# Patient Record
Sex: Female | Born: 1985 | Race: Black or African American | Hispanic: No | Marital: Single | Smoking: Current every day smoker
Health system: Southern US, Community
[De-identification: ages and names within clinical notes are randomized; demographics above are authoritative.]

## PROBLEM LIST (undated history)

## (undated) DIAGNOSIS — R030 Elevated blood-pressure reading, without diagnosis of hypertension: Secondary | ICD-10-CM

## (undated) HISTORY — DX: Elevated blood-pressure reading, without diagnosis of hypertension: R03.0

## (undated) HISTORY — PX: NO PAST SURGERIES: SHX2092

---
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
## 2006-02-09 ENCOUNTER — Emergency Department (HOSPITAL_COMMUNITY): Admission: EM | Admit: 2006-02-09 | Discharge: 2006-02-09 | Payer: Self-pay | Admitting: Emergency Medicine

## 2007-03-15 ENCOUNTER — Emergency Department (HOSPITAL_COMMUNITY): Admission: EM | Admit: 2007-03-15 | Discharge: 2007-03-15 | Payer: Self-pay | Admitting: Emergency Medicine

## 2007-05-03 ENCOUNTER — Inpatient Hospital Stay (HOSPITAL_COMMUNITY): Admission: AD | Admit: 2007-05-03 | Discharge: 2007-05-04 | Payer: Self-pay | Admitting: Obstetrics and Gynecology

## 2010-02-03 ENCOUNTER — Emergency Department (HOSPITAL_COMMUNITY): Admission: EM | Admit: 2010-02-03 | Discharge: 2010-02-03 | Payer: Self-pay | Admitting: Emergency Medicine

## 2010-12-29 LAB — URINALYSIS, ROUTINE W REFLEX MICROSCOPIC
Bilirubin Urine: NEGATIVE
Nitrite: NEGATIVE
Specific Gravity, Urine: 1.018 (ref 1.005–1.030)
Urobilinogen, UA: 1 mg/dL (ref 0.0–1.0)

## 2010-12-29 LAB — POCT I-STAT, CHEM 8
BUN: 10 mg/dL (ref 6–23)
Calcium, Ion: 1.11 mmol/L — ABNORMAL LOW (ref 1.12–1.32)
Chloride: 108 mEq/L (ref 96–112)
Creatinine, Ser: 0.7 mg/dL (ref 0.4–1.2)

## 2010-12-29 LAB — URINE MICROSCOPIC-ADD ON

## 2010-12-29 LAB — POCT PREGNANCY, URINE: Preg Test, Ur: NEGATIVE

## 2010-12-30 ENCOUNTER — Other Ambulatory Visit: Payer: Self-pay | Admitting: Obstetrics and Gynecology

## 2010-12-30 ENCOUNTER — Encounter (INDEPENDENT_AMBULATORY_CARE_PROVIDER_SITE_OTHER): Payer: Self-pay | Admitting: Obstetrics and Gynecology

## 2010-12-30 DIAGNOSIS — Z01419 Encounter for gynecological examination (general) (routine) without abnormal findings: Secondary | ICD-10-CM

## 2010-12-30 DIAGNOSIS — Z124 Encounter for screening for malignant neoplasm of cervix: Secondary | ICD-10-CM

## 2011-01-01 NOTE — Progress Notes (Unsigned)
NAME:  Katherine Wu, Katherine Wu                 ACCOUNT NO.:  [id number]  MEDICAL RECORD NO.:  [id number]           PATIENT TYPE:  A  LOCATION:  [facility]                   FACILITY:  [facility]  PHYSICIAN:  Argentina Donovan, MD        DATE OF BIRTH:  11-12-1985  DATE OF SERVICE:  12/30/2010                                 CLINIC NOTE  The patient is a 25 year old nulligravida African American female with some menstrual irregularity that which she corrected with the [facility] in the past, but has no insurance at this point.  She has no medical complaints.  She is 5 feet 10 inches tall, weighs 170 pounds and blood pressure is 113/74.  Examination of the external genitalia is normal.  BUS within normal limits.  Vagina is clean and well rugated.  Cervix is clean and nulliparous.  Uterus anterior with normal size, shape and consistency. Adnexa is normal.  The abdomen is soft, flat and nontender.  No masses or organomegaly.  The patient is given a prescription for Sprintec and Pap smear was taken.          ______________________________ Argentina Donovan, MD    PR/MEDQ  D:  12/30/2010  T:  12/31/2010  Job:  [id number]

## 2011-03-10 ENCOUNTER — Encounter: Payer: Self-pay | Admitting: Family Medicine

## 2011-06-06 ENCOUNTER — Inpatient Hospital Stay (INDEPENDENT_AMBULATORY_CARE_PROVIDER_SITE_OTHER)
Admission: RE | Admit: 2011-06-06 | Discharge: 2011-06-06 | Disposition: A | Discharge status: Home / Self Care | Payer: Self-pay | Source: Ambulatory Visit | Attending: Family Medicine | Admitting: Family Medicine

## 2011-06-06 DIAGNOSIS — K089 Disorder of teeth and supporting structures, unspecified: Secondary | ICD-10-CM

## 2011-06-06 DIAGNOSIS — K5289 Other specified noninfective gastroenteritis and colitis: Secondary | ICD-10-CM

## 2011-06-06 LAB — POCT URINALYSIS DIP (DEVICE)
Glucose, UA: NEGATIVE mg/dL
Specific Gravity, Urine: 1.02 (ref 1.005–1.030)
Urobilinogen, UA: 0.2 mg/dL (ref 0.0–1.0)
pH: 6 (ref 5.0–8.0)

## 2011-07-26 LAB — POCT PREGNANCY, URINE
Operator id: 120871
Preg Test, Ur: NEGATIVE

## 2011-07-29 LAB — URINE CULTURE

## 2011-07-29 LAB — BASIC METABOLIC PANEL
BUN: 7
CO2: 27
Chloride: 107
Creatinine, Ser: 0.72
Glucose, Bld: 105 — ABNORMAL HIGH

## 2011-07-29 LAB — URINALYSIS, ROUTINE W REFLEX MICROSCOPIC
Glucose, UA: NEGATIVE
Leukocytes, UA: NEGATIVE
Protein, ur: NEGATIVE
Urobilinogen, UA: 1

## 2011-07-29 LAB — URINE MICROSCOPIC-ADD ON

## 2011-07-29 LAB — CBC
MCHC: 34
MCV: 92.4
Platelets: 201

## 2011-07-29 LAB — DIFFERENTIAL
Basophils Relative: 0
Eosinophils Absolute: 0.1
Monocytes Relative: 9
Neutrophils Relative %: 57

## 2011-10-12 ENCOUNTER — Encounter: Payer: Self-pay | Admitting: Family Medicine

## 2011-10-12 ENCOUNTER — Emergency Department (HOSPITAL_COMMUNITY)
Admission: EM | Admit: 2011-10-12 | Discharge: 2011-10-12 | Disposition: A | Discharge status: Home / Self Care | Payer: Self-pay | Attending: Emergency Medicine | Admitting: Emergency Medicine

## 2011-10-12 DIAGNOSIS — F17200 Nicotine dependence, unspecified, uncomplicated: Secondary | ICD-10-CM | POA: Insufficient documentation

## 2011-10-12 DIAGNOSIS — R05 Cough: Secondary | ICD-10-CM | POA: Insufficient documentation

## 2011-10-12 DIAGNOSIS — R0682 Tachypnea, not elsewhere classified: Secondary | ICD-10-CM | POA: Insufficient documentation

## 2011-10-12 DIAGNOSIS — J45901 Unspecified asthma with (acute) exacerbation: Secondary | ICD-10-CM | POA: Insufficient documentation

## 2011-10-12 DIAGNOSIS — R059 Cough, unspecified: Secondary | ICD-10-CM | POA: Insufficient documentation

## 2011-10-12 DIAGNOSIS — R0602 Shortness of breath: Secondary | ICD-10-CM | POA: Insufficient documentation

## 2011-10-12 MED ORDER — ALBUTEROL SULFATE HFA 108 (90 BASE) MCG/ACT IN AERS
2.0000 | INHALATION_SPRAY | RESPIRATORY_TRACT | Status: DC | PRN
Start: 2011-10-12 — End: 2011-10-12
  Filled 2011-10-12: qty 6.7

## 2011-10-12 MED ORDER — ALBUTEROL SULFATE HFA 108 (90 BASE) MCG/ACT IN AERS: 1.0000 | INHALATION_SPRAY | Freq: Four times a day (QID) | RESPIRATORY_TRACT | Status: DC | PRN

## 2011-10-12 NOTE — ED Notes (Signed)
sts SOB and cough for 3 days and ran out of albuterol.

## 2011-10-12 NOTE — ED Provider Notes (Signed)
History     CSN: [id number]  Arrival date & time 10/12/11  1233   First MD Initiated Contact with Patient 10/12/11 1316      Chief Complaint  Patient presents with  . Shortness of Breath    (Consider location/radiation/quality/duration/timing/severity/associated sxs/prior treatment) HPI Comments: She ran out of her inhaler one week ago and symptoms started 3 days ago.  Patient is a 26 y.o. female presenting with wheezing.  Wheezing  The current episode started 3 to 5 days ago. The onset was gradual. The problem occurs continuously. The problem has been gradually worsening. The problem is moderate. The symptoms are relieved by beta-agonist inhalers. The symptoms are aggravated by activity and smoke exposure. Associated symptoms include cough and wheezing. Pertinent negatives include no chest pain, no fever, no rhinorrhea and no sore throat. The cough is non-productive. She has not inhaled smoke recently. She has had intermittent steroid use. She has had no prior hospitalizations. She has had no prior ICU admissions. She has had no prior intubations. Her past medical history is significant for asthma. She has been behaving normally. There were no sick contacts. She has received no recent medical care.    Past Medical History  Diagnosis Date  . Asthma     History reviewed. No pertinent past surgical history.  No family history on file.  History  Substance Use Topics  . Smoking status: Current Everyday Smoker  . Smokeless tobacco: Not on file  . Alcohol Use: Yes    OB History    Grav Para Term Preterm Abortions TAB SAB Ect Mult Living                  Review of Systems  Constitutional: Negative for fever.  HENT: Negative for sore throat and rhinorrhea.   Respiratory: Positive for cough and wheezing.   Cardiovascular: Negative for chest pain.  All other systems reviewed and are negative.    Allergies  Review of patient's allergies indicates no known allergies.  Home  Medications   Current Outpatient Rx  Name Route Sig Dispense Refill  . ALBUTEROL SULFATE HFA 108 (90 BASE) MCG/ACT IN AERS Inhalation Inhale 2 puffs into the lungs every 6 (six) hours as needed. For shortness of breath       BP 129/64  Pulse 50  Temp(Src) 97.9 F (36.6 C) (Oral)  Resp 16  SpO2 100%  LMP 10/06/2011  Physical Exam  Constitutional: She is oriented to person, place, and time. She appears well-developed and well-nourished. No distress.  HENT:  Head: Normocephalic and atraumatic.  Right Ear: External ear normal.  Left Ear: External ear normal.  Mouth/Throat: Oropharynx is clear and moist.  Eyes: Conjunctivae and EOM are normal. Pupils are equal, round, and reactive to light. Right eye exhibits no discharge.  Neck: Normal range of motion. Neck supple.  Cardiovascular: Normal rate, regular rhythm, normal heart sounds and intact distal pulses.   No murmur heard. Pulmonary/Chest: Tachypnea noted. No respiratory distress. She has no decreased breath sounds. She has wheezes. She has no rhonchi. She has no rales.       Mild faint wheezes in the lower lobes  Abdominal: Soft. There is no tenderness.  Musculoskeletal: Normal range of motion. She exhibits no edema and no tenderness.  Neurological: She is alert and oriented to person, place, and time.  Skin: Skin is warm and dry. No rash noted.  Psychiatric: She has a normal mood and affect.    ED Course  Procedures (including critical  care time)  Labs Reviewed - No data to display No results found.   No diagnosis found.    MDM   Pt with typical asthma exacerbation  symptoms.  No infectious sx, productive cough or other complaints.  Minimal Wheezing on exam.  will give inhaler but do not feel needs steroids today.         Gwyneth Sprout, MD 10/12/11 1343

## 2013-10-06 ENCOUNTER — Emergency Department (INDEPENDENT_AMBULATORY_CARE_PROVIDER_SITE_OTHER)
Admission: EM | Admit: 2013-10-06 | Discharge: 2013-10-06 | Disposition: A | Discharge status: Home / Self Care | Payer: Self-pay | Source: Home / Self Care | Attending: Family Medicine | Admitting: Family Medicine

## 2013-10-06 ENCOUNTER — Encounter (HOSPITAL_COMMUNITY): Payer: Self-pay | Admitting: Emergency Medicine

## 2013-10-06 DIAGNOSIS — J111 Influenza due to unidentified influenza virus with other respiratory manifestations: Secondary | ICD-10-CM

## 2013-10-06 MED ORDER — ACETAMINOPHEN 325 MG PO TABS
ORAL_TABLET | ORAL | Status: AC
  Filled 2013-10-06: qty 2

## 2013-10-06 MED ORDER — ACETAMINOPHEN 325 MG PO TABS
650.0000 mg | ORAL_TABLET | Freq: Once | ORAL | Status: AC
  Administered 2013-10-06: 650 mg via ORAL

## 2013-10-06 MED ORDER — OSELTAMIVIR PHOSPHATE 75 MG PO CAPS: 75.0000 mg | ORAL_CAPSULE | Freq: Two times a day (BID) | ORAL | Status: DC

## 2013-10-06 NOTE — ED Notes (Signed)
Pt c/o cold/flu like sxs onset yest night w/sxs that include: BA, fevers, chills, HA, SOB, runny nose... Hx of asthma Denies: v/n/d... Had her albuterol inhaler this am w/little relief She is alert w/no signs of acute distress.

## 2013-10-06 NOTE — ED Provider Notes (Signed)
Medical screening examination/treatment/procedure(s) were performed by resident physician or non-physician practitioner and as supervising physician I was immediately available for consultation/collaboration.   KINDL,JAMES DOUGLAS MD.   James D Kindl, MD 10/06/13 1509 

## 2013-10-06 NOTE — ED Provider Notes (Signed)
CSN: [id number]     Arrival date & time 10/06/13  3875 History   First MD Initiated Contact with Patient 10/06/13 [phone]     Chief Complaint  Patient presents with  . URI   (Consider location/radiation/quality/duration/timing/severity/associated sxs/prior Treatment) HPI Comments: No 2014 flu shot.  Patient is a 27 y.o. female presenting with URI. The history is provided by the patient.  URI Presenting symptoms: congestion, cough, fatigue, fever and rhinorrhea   Presenting symptoms: no sore throat   Severity:  Moderate Onset quality:  Sudden Duration:  1 day Timing:  Constant Progression:  Worsening Chronicity:  New Relieved by:  Nothing Ineffective treatments:  None tried Associated symptoms: headaches and myalgias   Associated symptoms: no neck pain, no sinus pain, no sneezing, no swollen glands and no wheezing   Risk factors: no immunosuppression and no sick contacts     Past Medical History  Diagnosis Date  . Asthma    History reviewed. No pertinent past surgical history. No family history on file. History  Substance Use Topics  . Smoking status: Current Every Day Smoker  . Smokeless tobacco: Not on file  . Alcohol Use: Yes   OB History   Grav Para Term Preterm Abortions TAB SAB Ect Mult Living                 Review of Systems  Constitutional: Positive for fever and fatigue.  HENT: Positive for congestion and rhinorrhea. Negative for sneezing and sore throat.   Eyes: Negative.   Respiratory: Positive for cough and chest tightness. Negative for shortness of breath and wheezing.   Cardiovascular: Negative.   Gastrointestinal: Negative.   Endocrine: Negative for polydipsia, polyphagia and polyuria.  Genitourinary: Negative.   Musculoskeletal: Positive for myalgias. Negative for neck pain.  Skin: Negative.   Allergic/Immunologic: Negative for immunocompromised state.  Neurological: Positive for headaches. Negative for weakness and light-headedness.   Hematological: Negative for adenopathy.  Psychiatric/Behavioral: Negative.     Allergies  Review of patient's allergies indicates no known allergies.  Home Medications   Current Outpatient Rx  Name  Route  Sig  Dispense  Refill  . albuterol (PROVENTIL HFA;VENTOLIN HFA) 108 (90 BASE) MCG/ACT inhaler   Inhalation   Inhale 2 puffs into the lungs every 6 (six) hours as needed. For shortness of breath          . EXPIRED: albuterol (PROVENTIL HFA;VENTOLIN HFA) 108 (90 BASE) MCG/ACT inhaler   Inhalation   Inhale 1-2 puffs into the lungs every 6 (six) hours as needed for wheezing.   1 Inhaler   1    BP 136/85  Pulse 85  Temp(Src) 101 F (38.3 C) (Oral)  Resp 18  SpO2 98%  LMP 09/30/2013 Physical Exam  Constitutional: She is oriented to person, place, and time. She appears well-developed and well-nourished. No distress.  HENT:  Head: Normocephalic and atraumatic.  Right Ear: Hearing, tympanic membrane, external ear and ear canal normal.  Left Ear: Hearing, tympanic membrane, external ear and ear canal normal.  Nose: Nose normal.  Mouth/Throat: Uvula is midline, oropharynx is clear and moist and mucous membranes are normal.  Eyes: Conjunctivae are normal. Right eye exhibits no discharge. Left eye exhibits no discharge. No scleral icterus.  Neck: Normal range of motion. Neck supple.  Cardiovascular: Normal rate, regular rhythm and normal heart sounds.   Pulmonary/Chest: Effort normal and breath sounds normal. No respiratory distress.  Abdominal: Soft. Bowel sounds are normal. There is no tenderness.  Musculoskeletal: Normal range  of motion. She exhibits no tenderness.  Lymphadenopathy:    She has no cervical adenopathy.  Neurological: She is alert and oriented to person, place, and time.  Skin: Skin is warm and dry. No rash noted.  Psychiatric: She has a normal mood and affect. Her behavior is normal.    ED Course  Procedures (including critical care time) Labs  Review Labs Reviewed - No data to display Imaging Review No results found.  EKG Interpretation    Date/Time:    Ventricular Rate:    PR Interval:    QRS Duration:   QT Interval:    QTC Calculation:   R Axis:     Text Interpretation:              MDM  Will treat patient empirically for influenza like illness with Tamiflu. Advised that she discontinue smoking, plenty of fluids and rest. Tylenol and/or ibuprofen as directed on packing for myalgias.   Jess Barters [place], [place] 10/06/13 1009

## 2013-10-09 NOTE — ED Notes (Signed)
returned call from message on answering machine . Patient c/o she is unable to afford medications rx for flu, and is no better , asking for an extension on her work note. Was advised we cannot extend note w.o exam

## 2015-03-30 ENCOUNTER — Ambulatory Visit (INDEPENDENT_AMBULATORY_CARE_PROVIDER_SITE_OTHER): Payer: Self-pay | Admitting: Emergency Medicine

## 2015-03-30 VITALS — BP 132/76 | HR 60 | Temp 98.4°F | Resp 16 | Ht 69.0 in | Wt 178.0 lb

## 2015-03-30 DIAGNOSIS — K029 Dental caries, unspecified: Secondary | ICD-10-CM

## 2015-03-30 MED ORDER — ACETAMINOPHEN-CODEINE #3 300-30 MG PO TABS: 1.0000 | ORAL_TABLET | ORAL | Status: DC | PRN

## 2015-03-30 MED ORDER — CEPHALEXIN 500 MG PO CAPS: 500.0000 mg | ORAL_CAPSULE | Freq: Four times a day (QID) | ORAL | Status: DC

## 2015-03-30 NOTE — Patient Instructions (Signed)
Dental Caries  Dental caries (also called tooth decay) is the most common oral disease. It can occur at any age but is more common in children and young adults.   HOW DENTAL CARIES DEVELOPS   The process of decay begins when bacteria and foods (particularly sugars and starches) combine in your mouth to produce plaque. Plaque is a substance that sticks to the hard, outer surface of a tooth (enamel). The bacteria in plaque produce acids that attack enamel. These acids may also attack the root surface of a tooth (cementum) if it is exposed. Repeated attacks dissolve these surfaces and create holes in the tooth (cavities). If left untreated, the acids destroy the other layers of the tooth.   RISK FACTORS   Frequent sipping of sugary beverages.    Frequent snacking on sugary and starchy foods, especially those that easily get stuck in the teeth.    Poor oral hygiene.    Dry mouth.    Substance abuse such as methamphetamine abuse.    Broken or poor-fitting dental restorations.    Eating disorders.    Gastroesophageal reflux disease (GERD).    Certain radiation treatments to the head and neck.  SYMPTOMS  In the early stages of dental caries, symptoms are seldom present. Sometimes white, chalky areas may be seen on the enamel or other tooth layers. In later stages, symptoms may include:   Pits and holes on the enamel.   Toothache after sweet, hot, or cold foods or drinks are consumed.   Pain around the tooth.   Swelling around the tooth.  DIAGNOSIS   Most of the time, dental caries is detected during a regular dental checkup. A diagnosis is made after a thorough medical and dental history is taken and the surfaces of your teeth are checked for signs of dental caries. Sometimes special instruments, such as lasers, are used to check for dental caries. Dental X-ray exams may be taken so that areas not visible to the eye (such as between the contact areas of the teeth) can be checked for cavities.    TREATMENT   If dental caries is in its early stages, it may be reversed with a fluoride treatment or an application of a remineralizing agent at the dental office. Thorough brushing and flossing at home is needed to aid these treatments. If it is in its later stages, treatment depends on the location and extent of tooth destruction:    If a small area of the tooth has been destroyed, the destroyed area will be removed and cavities will be filled with a material such as gold, silver amalgam, or composite resin.    If a large area of the tooth has been destroyed, the destroyed area will be removed and a cap (crown) will be fitted over the remaining tooth structure.    If the center part of the tooth (pulp) is affected, a procedure called a root canal will be needed before a filling or crown can be placed.    If most of the tooth has been destroyed, the tooth may need to be pulled (extracted).  HOME CARE INSTRUCTIONS  You can prevent, stop, or reverse dental caries at home by practicing good oral hygiene. Good oral hygiene includes:   Thoroughly cleaning your teeth at least twice a day with a toothbrush and dental floss.    Using a fluoride toothpaste. A fluoride mouth rinse may also be used if recommended by your dentist or health care provider.    Restricting   the amount of sugary and starchy foods and sugary liquids you consume.    Avoiding frequent snacking on these foods and sipping of these liquids.    Keeping regular visits with a dentist for checkups and cleanings.  PREVENTION    Practice good oral hygiene.   Consider a dental sealant. A dental sealant is a coating material that is applied by your dentist to the pits and grooves of teeth. The sealant prevents food from being trapped in them. It may protect the teeth for several years.   Ask about fluoride supplements if you live in a community without fluorinated water or with water that has a low fluoride content. Use fluoride supplements  as directed by your dentist or health care provider.   Allow fluoride varnish applications to teeth if directed by your dentist or health care provider.  Document Released: 06/19/2002 Document Revised: 02/11/2014 Document Reviewed: 09/29/2012  [employer] Patient Information 2015 [employer]. This information is not intended to replace advice given to you by your health care provider. Make sure you discuss any questions you have with your health care provider.

## 2015-03-30 NOTE — Progress Notes (Signed)
Subjective:  Patient ID: Katherine Wu, female    DOB: [birth year]  Age: 29 y.o. MRN: [id number]  CC: Dental Pain   HPI Katherine Wu presents  with several days history of pain in her upper right jaw. Severity she has thermal sensitivity of STDs denies any fever chills and some swelling on that side of her face. There is no Nausea vomiting or cough or coryza. She's had no improvement of her symptoms with over-the-counter medication  History Katherine has a past medical history of Asthma.   She has no past surgical history on file.   Her  family history is not on file.  She   reports that she has been smoking.  She does not have any smokeless tobacco history on file. She reports that she drinks alcohol. She reports that she does not use illicit drugs.  Outpatient Prescriptions Prior to Visit  Medication Sig Dispense Refill  . albuterol (PROVENTIL HFA;VENTOLIN HFA) 108 (90 BASE) MCG/ACT inhaler Inhale 2 puffs into the lungs every 6 (six) hours as needed. For shortness of breath     . albuterol (PROVENTIL HFA;VENTOLIN HFA) 108 (90 BASE) MCG/ACT inhaler Inhale 1-2 puffs into the lungs every 6 (six) hours as needed for wheezing. 1 Inhaler 1  . oseltamivir (TAMIFLU) 75 MG capsule Take 1 capsule (75 mg total) by mouth every 12 (twelve) hours. 10 capsule 0   No facility-administered medications prior to visit.    History   Social History  . Marital Status: Single    Spouse Name: N/A  . Number of Children: N/A  . Years of Education: N/A   Social History Main Topics  . Smoking status: Current Every Day Smoker  . Smokeless tobacco: Not on file  . Alcohol Use: Yes  . Drug Use: No  . Sexual Activity: Not on file   Other Topics Concern  . None   Social History Narrative     Review of Systems  Objective:  BP 132/76 mmHg  Pulse 60  Temp(Src) 98.4 F (36.9 C)  Resp 16  Ht 5\' 9"  (1.753 m)  Wt 178 lb (80.74 kg)  BMI 26.27 kg/m2  SpO2 98%  LMP 02/27/2015  Physical  Exam    Assessment & Plan:   Katherine was seen today for dental pain.  Diagnoses and all orders for this visit:  Dental caries  Other orders -     acetaminophen-codeine (TYLENOL #3) 300-30 MG per tablet; Take 1-2 tablets by mouth every 4 (four) hours as needed. -     cephALEXin (KEFLEX) 500 MG capsule; Take 1 capsule (500 mg total) by mouth 4 (four) times daily.   I have discontinued Ms. Graf oseltamivir. I am also having her start on acetaminophen-codeine and cephALEXin. Additionally, I am having her maintain her albuterol and albuterol.  Meds ordered this encounter  Medications  . acetaminophen-codeine (TYLENOL #3) 300-30 MG per tablet    Sig: Take 1-2 tablets by mouth every 4 (four) hours as needed.    Dispense:  30 tablet    Refill:  0  . cephALEXin (KEFLEX) 500 MG capsule    Sig: Take 1 capsule (500 mg total) by mouth 4 (four) times daily.    Dispense:  40 capsule    Refill:  0    She was referred to a dentist of her choice.  Appropriate red flag conditions were discussed with the patient as well as actions that should be taken.  Patient expressed his understanding.  Follow-up: Return  if symptoms worsen or fail to improve.  Roselee Culver, MD

## 2015-04-26 ENCOUNTER — Emergency Department (HOSPITAL_COMMUNITY)
Admission: EM | Admit: 2015-04-26 | Discharge: 2015-04-27 | Disposition: A | Discharge status: Home / Self Care | Payer: Self-pay | Attending: Emergency Medicine | Admitting: Emergency Medicine

## 2015-04-26 ENCOUNTER — Encounter (HOSPITAL_COMMUNITY): Payer: Self-pay | Admitting: Emergency Medicine

## 2015-04-26 DIAGNOSIS — Z3202 Encounter for pregnancy test, result negative: Secondary | ICD-10-CM | POA: Insufficient documentation

## 2015-04-26 DIAGNOSIS — R064 Hyperventilation: Secondary | ICD-10-CM | POA: Insufficient documentation

## 2015-04-26 DIAGNOSIS — R51 Headache: Secondary | ICD-10-CM | POA: Insufficient documentation

## 2015-04-26 DIAGNOSIS — H539 Unspecified visual disturbance: Secondary | ICD-10-CM | POA: Insufficient documentation

## 2015-04-26 DIAGNOSIS — R0789 Other chest pain: Secondary | ICD-10-CM | POA: Insufficient documentation

## 2015-04-26 DIAGNOSIS — Z720 Tobacco use: Secondary | ICD-10-CM | POA: Insufficient documentation

## 2015-04-26 DIAGNOSIS — J45901 Unspecified asthma with (acute) exacerbation: Secondary | ICD-10-CM | POA: Insufficient documentation

## 2015-04-26 DIAGNOSIS — R42 Dizziness and giddiness: Secondary | ICD-10-CM | POA: Insufficient documentation

## 2015-04-26 DIAGNOSIS — R6883 Chills (without fever): Secondary | ICD-10-CM | POA: Insufficient documentation

## 2015-04-26 DIAGNOSIS — R61 Generalized hyperhidrosis: Secondary | ICD-10-CM | POA: Insufficient documentation

## 2015-04-26 DIAGNOSIS — E860 Dehydration: Secondary | ICD-10-CM | POA: Insufficient documentation

## 2015-04-26 DIAGNOSIS — H9190 Unspecified hearing loss, unspecified ear: Secondary | ICD-10-CM | POA: Insufficient documentation

## 2015-04-26 LAB — CBC WITH DIFFERENTIAL/PLATELET
Basophils Absolute: 0 10*3/uL (ref 0.0–0.1)
Basophils Relative: 0 % (ref 0–1)
EOS PCT: 2 % (ref 0–5)
Eosinophils Absolute: 0.1 10*3/uL (ref 0.0–0.7)
HCT: 37.2 % (ref 36.0–46.0)
Hemoglobin: 12.5 g/dL (ref 12.0–15.0)
LYMPHS ABS: 1.3 10*3/uL (ref 0.7–4.0)
LYMPHS PCT: 20 % (ref 12–46)
MCH: 30.3 pg (ref 26.0–34.0)
MCHC: 33.6 g/dL (ref 30.0–36.0)
MCV: 90.3 fL (ref 78.0–100.0)
Monocytes Absolute: 0.5 10*3/uL (ref 0.1–1.0)
Monocytes Relative: 8 % (ref 3–12)
NEUTROS ABS: 4.6 10*3/uL (ref 1.7–7.7)
Neutrophils Relative %: 70 % (ref 43–77)
PLATELETS: 265 10*3/uL (ref 150–400)
RBC: 4.12 MIL/uL (ref 3.87–5.11)
RDW: 12.9 % (ref 11.5–15.5)
WBC: 6.6 10*3/uL (ref 4.0–10.5)

## 2015-04-26 LAB — COMPREHENSIVE METABOLIC PANEL
ALK PHOS: 54 U/L (ref 38–126)
ALT: 14 U/L (ref 14–54)
AST: 19 U/L (ref 15–41)
Albumin: 3.9 g/dL (ref 3.5–5.0)
Anion gap: 8 (ref 5–15)
BILIRUBIN TOTAL: 0.4 mg/dL (ref 0.3–1.2)
BUN: 9 mg/dL (ref 6–20)
CALCIUM: 9.2 mg/dL (ref 8.9–10.3)
CHLORIDE: 106 mmol/L (ref 101–111)
CO2: 26 mmol/L (ref 22–32)
Creatinine, Ser: 1 mg/dL (ref 0.44–1.00)
GLUCOSE: 131 mg/dL — AB (ref 65–99)
POTASSIUM: 3.9 mmol/L (ref 3.5–5.1)
SODIUM: 140 mmol/L (ref 135–145)
Total Protein: 6.8 g/dL (ref 6.5–8.1)

## 2015-04-26 MED ORDER — DIPHENHYDRAMINE HCL 50 MG/ML IJ SOLN
25.0000 mg | Freq: Once | INTRAMUSCULAR | Status: AC
  Administered 2015-04-26: 25 mg via INTRAVENOUS
  Filled 2015-04-26: qty 1

## 2015-04-26 MED ORDER — SODIUM CHLORIDE 0.9 % IV SOLN
1000.0000 mL | Freq: Once | INTRAVENOUS | Status: AC
  Administered 2015-04-26: 1000 mL via INTRAVENOUS

## 2015-04-26 MED ORDER — METOCLOPRAMIDE HCL 5 MG/ML IJ SOLN
10.0000 mg | Freq: Once | INTRAMUSCULAR | Status: AC
  Administered 2015-04-26: 10 mg via INTRAVENOUS
  Filled 2015-04-26: qty 2

## 2015-04-26 MED ORDER — SODIUM CHLORIDE 0.9 % IV SOLN: 1000.0000 mL | INTRAVENOUS | Status: DC

## 2015-04-26 NOTE — ED Provider Notes (Addendum)
CSN: [id number]     Arrival date & time 04/26/15  2238 History  This chart was scribed for Delora Fuel, MD by Chester Holstein, ED Scribe. This patient was seen in room A09C/A09C and the patient's care was started at 11:27 PM.     No chief complaint on file.    The history is provided by the patient. No language interpreter was used.   HPI Comments: Katherine Wu is a 29 y.o. female with PMHx of asthma who presents to the Emergency Department complaining of near syncope and dizziness lasting approximately 30 minutes with onset earlier today. Pt states she felt as though she was going to "fall out" at onset. She notes associated 7/10 lower abdominal pain, headache, SOB, brief blurred vision, mild hearing loss, generalized weakness, dry mouth, diaphoresis, chills, and tingling in fingers. 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 She had to leave the room and sit in her car before symptoms began to subside. Pt with h/o asthma reports recent increase in chest tightness. She started her menstrual cycle today. She reports she is still currently feeling weak and experiencing headache and abdominal pain. Pt is not on birth control. Pt denies nausea, vomiting, fever, cough, and difficulty urinating.   Past Medical History  Diagnosis Date  . Asthma    History reviewed. No pertinent past surgical history. No family history on file. History  Substance Use Topics  . Smoking status: Current Every Day Smoker  . Smokeless tobacco: Not on file  . Alcohol Use: Yes   OB History    No data available     Review of Systems  Constitutional: Positive for chills and diaphoresis. Negative for fever.  HENT: Positive for hearing loss.   Eyes: Positive for visual disturbance.  Respiratory: Positive for chest tightness and shortness of breath. Negative for cough.   Gastrointestinal: Positive for abdominal pain. Negative for nausea and vomiting.  Genitourinary: Negative  for difficulty urinating.  Neurological: Positive for dizziness, weakness (generalized) and headaches. Negative for syncope and numbness.  All other systems reviewed and are negative.     Allergies  Review of patient's allergies indicates no known allergies.  Home Medications   Prior to Admission medications   Medication Sig Start Date End Date Taking? Authorizing Provider  acetaminophen-codeine (TYLENOL #3) 300-30 MG per tablet Take 1-2 tablets by mouth every 4 (four) hours as needed. Patient not taking: Reported on 04/26/2015 03/30/15   Roselee Culver, MD  albuterol (PROVENTIL HFA;VENTOLIN HFA) 108 (90 BASE) MCG/ACT inhaler Inhale 1-2 puffs into the lungs every 6 (six) hours as needed for wheezing. Patient not taking: Reported on 04/26/2015 10/12/11 10/11/12  Blanchie Dessert, MD  cephALEXin (KEFLEX) 500 MG capsule Take 1 capsule (500 mg total) by mouth 4 (four) times daily. Patient not taking: Reported on 04/26/2015 03/30/15   Roselee Culver, MD   BP 118/72 mmHg  Pulse 63  Temp(Src) 98.1 F (36.7 C) (Oral)  Resp 16  Ht 5\' 10"  (1.778 m)  Wt 174 lb 6.4 oz (79.107 kg)  BMI 25.02 kg/m2  SpO2 98%  LMP 04/26/2015 Physical Exam  Constitutional: She is oriented to person, place, and time. She appears well-developed and well-nourished.  HENT:  Head: Normocephalic.  Mouth/Throat: Mucous membranes are dry.  Eyes: Conjunctivae are normal. Pupils are equal, round, and reactive to light.  Neck: Normal range of motion. Neck supple. No JVD present.  Cardiovascular: Normal rate, regular rhythm and normal  heart sounds.   No murmur heard. Pulmonary/Chest: Effort normal and breath sounds normal. She has no wheezes. She has no rales. She exhibits no tenderness.  Abdominal: Soft. Bowel sounds are normal. She exhibits no distension and no mass. There is no tenderness.  Musculoskeletal: Normal range of motion. She exhibits no edema.  Lymphadenopathy:    She has no cervical adenopathy.   Neurological: She is alert and oriented to person, place, and time. No cranial nerve deficit. She exhibits normal muscle tone. Coordination normal.  Skin: Skin is warm and dry. No rash noted.  Psychiatric: She has a normal mood and affect. Her behavior is normal. Judgment and thought content normal.  Nursing note and vitals reviewed.   ED Course  Procedures (including critical care time) DIAGNOSTIC STUDIES: Oxygen Saturation is 98% on room air, normal by my interpretation.    COORDINATION OF CARE: 11:35 PM Discussed treatment plan with patient at beside, the patient agrees with the plan and has no further questions at this time.   Labs Review Results for orders placed or performed during the hospital encounter of 04/26/15  CBC with Differential  Result Value Ref Range   WBC 6.6 4.0 - 10.5 K/uL   RBC 4.12 3.87 - 5.11 MIL/uL   Hemoglobin 12.5 12.0 - 15.0 g/dL   HCT 37.2 36.0 - 46.0 %   MCV 90.3 78.0 - 100.0 fL   MCH 30.3 26.0 - 34.0 pg   MCHC 33.6 30.0 - 36.0 g/dL   RDW 12.9 11.5 - 15.5 %   Platelets 265 150 - 400 K/uL   Neutrophils Relative % 70 43 - 77 %   Neutro Abs 4.6 1.7 - 7.7 K/uL   Lymphocytes Relative 20 12 - 46 %   Lymphs Abs 1.3 0.7 - 4.0 K/uL   Monocytes Relative 8 3 - 12 %   Monocytes Absolute 0.5 0.1 - 1.0 K/uL   Eosinophils Relative 2 0 - 5 %   Eosinophils Absolute 0.1 0.0 - 0.7 K/uL   Basophils Relative 0 0 - 1 %   Basophils Absolute 0.0 0.0 - 0.1 K/uL  Comprehensive metabolic panel  Result Value Ref Range   Sodium 140 135 - 145 mmol/L   Potassium 3.9 3.5 - 5.1 mmol/L   Chloride 106 101 - 111 mmol/L   CO2 26 22 - 32 mmol/L   Glucose, Bld 131 (H) 65 - 99 mg/dL   BUN 9 6 - 20 mg/dL   Creatinine, Ser 1.00 0.44 - 1.00 mg/dL   Calcium 9.2 8.9 - 10.3 mg/dL   Total Protein 6.8 6.5 - 8.1 g/dL   Albumin 3.9 3.5 - 5.0 g/dL   AST 19 15 - 41 U/L   ALT 14 14 - 54 U/L   Alkaline Phosphatase 54 38 - 126 U/L   Total Bilirubin 0.4 0.3 - 1.2 mg/dL   GFR calc non Af  Amer >60 >60 mL/min   GFR calc Af Amer >60 >60 mL/min   Anion gap 8 5 - 15     MDM   Final diagnoses:  Dehydration  Hyperventilation    Dizziness and numbness of in pattern suggestive of hyperventilation syndrome. She also was sweating profusely and may be somewhat dehydrated. Mucous membranes are noted to be slightly dry. She will be given IV hydration. She also has a mild headache and will be given a headache cocktail of metoclopramide and diphenhydramine. Screening labs obtained, but no red flags to suggest serious pathology.   Laboratory workup is  unremarkable. She feels much better after above noted treatment and is discharged. She is encouraged to make sure to maintain adequate hydration when she is working.  I personally performed the services described in this documentation, which was scribed in my presence. The recorded information has been reviewed and is accurate.       Delora Fuel, MD 93/55/21 [id number]  Delora Fuel, MD 59/53/96 [id number]

## 2015-04-26 NOTE — ED Notes (Signed)
Pt. presents with multiple complaints : menstrual cramps , blurred vision , generalized weakness , tingling at fingers , dry mouth and mild SOB onset today .

## 2015-04-27 LAB — URINALYSIS, ROUTINE W REFLEX MICROSCOPIC
BILIRUBIN URINE: NEGATIVE
Glucose, UA: NEGATIVE mg/dL
KETONES UR: NEGATIVE mg/dL
NITRITE: NEGATIVE
Protein, ur: NEGATIVE mg/dL
Specific Gravity, Urine: 1.01 (ref 1.005–1.030)
UROBILINOGEN UA: 0.2 mg/dL (ref 0.0–1.0)
pH: 7 (ref 5.0–8.0)

## 2015-04-27 LAB — URINE MICROSCOPIC-ADD ON

## 2015-04-27 LAB — POC URINE PREG, ED: Preg Test, Ur: NEGATIVE

## 2015-04-27 NOTE — Discharge Instructions (Signed)
Dehydration, Adult Dehydration is when you lose more fluids from the body than you take in. Vital organs like the kidneys, brain, and heart cannot function without a proper amount of fluids and salt. Any loss of fluids from the body can cause dehydration.  CAUSES   Vomiting.  Diarrhea.  Excessive sweating.  Excessive urine output.  Fever. SYMPTOMS  Mild dehydration  Thirst.  Dry lips.  Slightly dry mouth. Moderate dehydration  Very dry mouth.  Sunken eyes.  Skin does not bounce back quickly when lightly pinched and released.  Dark urine and decreased urine production.  Decreased tear production.  Headache. Severe dehydration  Very dry mouth.  Extreme thirst.  Rapid, weak pulse (more than 100 beats per minute at rest).  Cold hands and feet.  Not able to sweat in spite of heat and temperature.  Rapid breathing.  Blue lips.  Confusion and lethargy.  Difficulty being awakened.  Minimal urine production.  No tears. DIAGNOSIS  Your caregiver will diagnose dehydration based on your symptoms and your exam. Blood and urine tests will help confirm the diagnosis. The diagnostic evaluation should also identify the cause of dehydration. TREATMENT  Treatment of mild or moderate dehydration can often be done at home by increasing the amount of fluids that you drink. It is best to drink small amounts of fluid more often. Drinking too much at one time can make vomiting worse. Refer to the home care instructions below. Severe dehydration needs to be treated at the hospital where you will probably be given intravenous (IV) fluids that contain water and electrolytes. HOME CARE INSTRUCTIONS   Ask your caregiver about specific rehydration instructions.  Drink enough fluids to keep your urine clear or pale yellow.  Drink small amounts frequently if you have nausea and vomiting.  Eat as you normally do.  Avoid:  Foods or drinks high in sugar.  Carbonated  drinks.  Juice.  Extremely hot or cold fluids.  Drinks with caffeine.  Fatty, greasy foods.  Alcohol.  Tobacco.  Overeating.  Gelatin desserts.  Wash your hands well to avoid spreading bacteria and viruses.  Only take over-the-counter or prescription medicines for pain, discomfort, or fever as directed by your caregiver.  Ask your caregiver if you should continue all prescribed and over-the-counter medicines.  Keep all follow-up appointments with your caregiver. SEEK MEDICAL CARE IF:  You have abdominal pain and it increases or stays in one area (localizes).  You have a rash, stiff neck, or severe headache.  You are irritable, sleepy, or difficult to awaken.  You are weak, dizzy, or extremely thirsty. SEEK IMMEDIATE MEDICAL CARE IF:   You are unable to keep fluids down or you get worse despite treatment.  You have frequent episodes of vomiting or diarrhea.  You have blood or green matter (bile) in your vomit.  You have blood in your stool or your stool looks black and tarry.  You have not urinated in 6 to 8 hours, or you have only urinated a small amount of very dark urine.  You have a fever.  You faint. MAKE SURE YOU:   Understand these instructions.  Will watch your condition.  Will get help right away if you are not doing well or get worse. Document Released: 09/27/2005 Document Revised: 12/20/2011 Document Reviewed: 05/17/2011 [facility] Patient Information 2015 [place], [place]. This information is not intended to replace advice given to you by your health care provider. Make sure you discuss any questions you have with your health care  provider.   Hyperventilation Hyperventilation is breathing that is deeper and more rapid than normal. It is usually associated with panic and anxiety. Hyperventilation can make you feel breathless. It is sometimes called overbreathing. Breathing out too much causes a decrease in the amount of carbon dioxide gas in the  blood. This leads to tingling and numbness in the hands, feet, and around the mouth. If this continues, your fingers, hands, and toes may begin to spasm. Hyperventilation usually lasts 20-30 minutes and can be associated with other symptoms of panic and anxiety, including:   Chest pains or tightness.  A pounding or irregular, racing heartbeat (palpitations).  Dizziness.  Lightheadedness.  Dry mouth.  Weakness.  Confusion.  Sleep disturbance. CAUSES  Sudden onset (acute) hyperventilation is usually triggered by acute stress, anxiety, or emotional upset. Long-term (chronic) and recurring hyperventilation can occur with chronic lung problems, such emphysema or asthma. Other causes include:   Nervousness.  Stress.  Stimulant, drug, or alcohol use.  Lung disease.  Infections, such as pneumonia.  Heart problems.  Severe pain.  Waking from a bad dream.  Pregnancy.  Bleeding. HOME CARE INSTRUCTIONS  Learn and use breathing exercises that help you breathe from your diaphragm and abdomen.  Practice relaxation techniques to reduce stress, such as visualization, meditation, and muscle release.  During an attack, try breathing into a paper bag. This changes the carbon dioxide level and slows down breathing. SEEK IMMEDIATE MEDICAL CARE IF:  Your hyperventilation continues or gets worse. MAKE SURE YOU:  Understand these instructions.  Will watch your condition.  Will get help right away if you are not doing well or get worse. Document Released: 09/24/2000 Document Revised: 03/28/2012 Document Reviewed: 01/06/2012 [facility] Patient Information 2015 [place], [place]. This information is not intended to replace advice given to you by your health care provider. Make sure you discuss any questions you have with your health care provider.

## 2015-04-27 NOTE — ED Notes (Signed)
Roxanne Mins, MD at bedside.

## 2015-06-02 ENCOUNTER — Encounter (HOSPITAL_COMMUNITY): Payer: Self-pay | Admitting: Emergency Medicine

## 2015-06-02 ENCOUNTER — Emergency Department (INDEPENDENT_AMBULATORY_CARE_PROVIDER_SITE_OTHER)
Admission: EM | Admit: 2015-06-02 | Discharge: 2015-06-02 | Disposition: A | Discharge status: Home / Self Care | Payer: Self-pay | Source: Home / Self Care | Attending: Emergency Medicine | Admitting: Emergency Medicine

## 2015-06-02 DIAGNOSIS — K0889 Other specified disorders of teeth and supporting structures: Secondary | ICD-10-CM

## 2015-06-02 DIAGNOSIS — K088 Other specified disorders of teeth and supporting structures: Secondary | ICD-10-CM

## 2015-06-02 MED ORDER — IBUPROFEN 800 MG PO TABS: 800.0000 mg | ORAL_TABLET | Freq: Three times a day (TID) | ORAL | Status: DC | PRN

## 2015-06-02 MED ORDER — AMOXICILLIN 500 MG PO CAPS
500.0000 mg | ORAL_CAPSULE | Freq: Two times a day (BID) | ORAL | Status: DC
Start: 2015-06-02 — End: 2016-04-19

## 2015-06-02 MED ORDER — HYDROCODONE-ACETAMINOPHEN 5-325 MG PO TABS: 1.0000 | ORAL_TABLET | Freq: Four times a day (QID) | ORAL | Status: DC | PRN

## 2015-06-02 NOTE — ED Provider Notes (Signed)
CSN: [id number]     Arrival date & time 06/02/15  1350 History   First MD Initiated Contact with Patient 06/02/15 1519     No chief complaint on file. CC: dental pain  (Consider location/radiation/quality/duration/timing/severity/associated sxs/prior Treatment) HPI She is a 29 year old woman here for evaluation of dental pain. She states this started about 2 weeks ago, but got acutely worse this morning. It is located in the right upper jaw. She states when she woke up this morning her right cheek area was swollen. She also reports feeling unwell for the last 2 days. No fevers or chills. She does have a dentist, but does have balance and is unable to pay it off at this time.  Past Medical History  Diagnosis Date  . Asthma    No past surgical history on file. No family history on file. Social History  Substance Use Topics  . Smoking status: Current Every Day Smoker  . Smokeless tobacco: Not on file  . Alcohol Use: Yes   OB History    No data available     Review of Systems As in history of present illness Allergies  Review of patient's allergies indicates no known allergies.  Home Medications   Prior to Admission medications   Medication Sig Start Date End Date Taking? Authorizing Provider  amoxicillin (AMOXIL) 500 MG capsule Take 1 capsule (500 mg total) by mouth 2 (two) times daily. 06/02/15   Melony Overly, MD  HYDROcodone-acetaminophen (NORCO) 5-325 MG per tablet Take 1 tablet by mouth every 6 (six) hours as needed for moderate pain or severe pain. 06/02/15   Melony Overly, MD  ibuprofen (ADVIL,MOTRIN) 800 MG tablet Take 1 tablet (800 mg total) by mouth every 8 (eight) hours as needed for moderate pain. 06/02/15   Melony Overly, MD   BP 122/79 mmHg  Pulse 68  Temp(Src) 98.3 F (36.8 C) (Oral)  Resp 16  SpO2 95% Physical Exam  Constitutional: She is oriented to person, place, and time. She appears well-developed and well-nourished. No distress.  HENT:  Mouth/Throat: Dental  caries present.    Cardiovascular: Normal rate.   Pulmonary/Chest: Effort normal.  Neurological: She is alert and oriented to person, place, and time.    ED Course  Procedures (including critical care time) Labs Review Labs Reviewed - No data to display  Imaging Review No results found.   MDM   1. Pain, dental    Treatment with amoxicillin, ibuprofen, Norco. Handout for low cost dental resources given.    Melony Overly, MD 06/02/15 [phone]

## 2015-06-02 NOTE — ED Notes (Signed)
Pt has been suffering from dental pain, but has not made an appointment with her dentist yet because she knew they would not see her until she had been on antibiotics and the swelling had gone down.

## 2015-06-02 NOTE — Discharge Instructions (Signed)
Abscessed Tooth An abscessed tooth is an infection around your tooth. It may be caused by holes or damage to the tooth (cavity) or a dental disease. An abscessed tooth causes mild to very bad pain in and around the tooth. See your dentist right away if you have tooth or gum pain. HOME CARE  Take your medicine as told. Finish it even if you start to feel better.  Do not drive after taking pain medicine.  Rinse your mouth (gargle) often with salt water ( teaspoon salt in 8 ounces of warm water).  Do not apply heat to the outside of your face. GET HELP RIGHT AWAY IF:   You have a temperature by mouth above 102 F (38.9 C), not controlled by medicine.  You have chills and a very bad headache.  You have problems breathing or swallowing.  Your mouth will not open.  You develop puffiness (swelling) on the neck or around the eye.  Your pain is not helped by medicine.  Your pain is getting worse instead of better. MAKE SURE YOU:   Understand these instructions.  Will watch your condition.  Will get help right away if you are not doing well or get worse. Document Released: 03/15/2008 Document Revised: 12/20/2011 Document Reviewed: 01/05/2011 [employer] Patient Information 2015 [employer]. This information is not intended to replace advice given to you by your health care provider. Make sure you discuss any questions you have with your health care provider.  

## 2016-04-19 ENCOUNTER — Encounter (HOSPITAL_COMMUNITY): Payer: Self-pay | Admitting: Emergency Medicine

## 2016-04-19 ENCOUNTER — Emergency Department (HOSPITAL_COMMUNITY): Payer: Self-pay

## 2016-04-19 ENCOUNTER — Emergency Department (HOSPITAL_COMMUNITY)
Admission: EM | Admit: 2016-04-19 | Discharge: 2016-04-20 | Disposition: A | Discharge status: Home / Self Care | Payer: Self-pay | Attending: Emergency Medicine | Admitting: Emergency Medicine

## 2016-04-19 DIAGNOSIS — M79641 Pain in right hand: Secondary | ICD-10-CM | POA: Insufficient documentation

## 2016-04-19 DIAGNOSIS — Y990 Civilian activity done for income or pay: Secondary | ICD-10-CM | POA: Insufficient documentation

## 2016-04-19 DIAGNOSIS — J45909 Unspecified asthma, uncomplicated: Secondary | ICD-10-CM | POA: Insufficient documentation

## 2016-04-19 DIAGNOSIS — W228XXA Striking against or struck by other objects, initial encounter: Secondary | ICD-10-CM | POA: Insufficient documentation

## 2016-04-19 DIAGNOSIS — F17200 Nicotine dependence, unspecified, uncomplicated: Secondary | ICD-10-CM | POA: Insufficient documentation

## 2016-04-19 DIAGNOSIS — Y9389 Activity, other specified: Secondary | ICD-10-CM | POA: Insufficient documentation

## 2016-04-19 DIAGNOSIS — Y929 Unspecified place or not applicable: Secondary | ICD-10-CM | POA: Insufficient documentation

## 2016-04-19 MED ORDER — IBUPROFEN 200 MG PO TABS
400.0000 mg | ORAL_TABLET | Freq: Once | ORAL | Status: AC | PRN
  Administered 2016-04-19: 400 mg via ORAL
  Filled 2016-04-19: qty 2

## 2016-04-19 NOTE — ED Notes (Signed)
Patient reports she got mad and punched a box of frozen chicken at work. C/o right wrist and forearm pain. Radial pulse intact, no obvious deformity/swelling/bruising noted.

## 2016-04-19 NOTE — Discharge Instructions (Signed)

## 2016-04-20 NOTE — ED Provider Notes (Signed)
CSN: [id number]     Arrival date & time 04/19/16  2113 History   First MD Initiated Contact with Patient 04/19/16 2206     Chief Complaint  Patient presents with  . Wrist Pain     (Consider location/radiation/quality/duration/timing/severity/associated sxs/prior Treatment) HPI Comments: 30 year old female presents with right hand pain following an injury earlier today. Patient is right-hand dominant. Significant past medical history. She states that she got mad earlier today and punched a box of frozen chicken at work. She states the headache and the pain is mostly in her right middle finger and radiates to her wrist and right elbow. Reports she has difficulty with range of motion of fingers and wrist. Also is reporting some swelling and numbness in the fingertips. Denies taking anything for pain prior to arrival.  Patient is a 30 y.o. female presenting with wrist pain.  Wrist Pain Associated symptoms include arthralgias, joint swelling and numbness. Pertinent negatives include no weakness.    Past Medical History  Diagnosis Date  . Asthma    History reviewed. No pertinent past surgical history. No family history on file. Social History  Substance Use Topics  . Smoking status: Current Every Day Smoker  . Smokeless tobacco: None  . Alcohol Use: Yes   OB History    No data available     Review of Systems  Musculoskeletal: Positive for joint swelling and arthralgias.  Neurological: Positive for numbness. Negative for weakness.      Allergies  Review of patient's allergies indicates no known allergies.  Home Medications   Prior to Admission medications   Not on File   BP 140/96 mmHg  Pulse 85  Temp(Src) 98.9 F (37.2 C) (Oral)  Resp 20  Ht 5\' 10"  (1.778 m)  Wt 68.04 kg  BMI 21.52 kg/m2  SpO2 100%  LMP 04/19/2016   Physical Exam  Constitutional: She is oriented to person, place, and time. She appears well-developed and well-nourished. No distress.  HENT:   Head: Normocephalic and atraumatic.  Eyes: Conjunctivae are normal. Pupils are equal, round, and reactive to light. Right eye exhibits no discharge. Left eye exhibits no discharge. No scleral icterus.  Neck: Normal range of motion.  Cardiovascular: Normal rate.   Pulmonary/Chest: Effort normal. No respiratory distress.  Abdominal: She exhibits no distension.  Musculoskeletal:  Right elbow: No obvious swelling or deformity. No tenderness to palpation. FROM.  Right forearm: No obvious swelling or deformity. No tenderness to palpation. Right wrist: No obvious swelling or deformity. Mild tenderness of wrist. No scaphoid tenderness. FROM. N/V intact. Right fingers: No obvious swelling or deformity. Tenderness to palpation of middle finger. Decreased ROM due to pain.          Neurological: She is alert and oriented to person, place, and time.  Skin: Skin is warm and dry.  Psychiatric: She has a normal mood and affect. Her behavior is normal.    ED Course  Procedures (including critical care time) Labs Review Labs Reviewed - No data to display  Imaging Review Dg Forearm Right  04/19/2016  CLINICAL DATA:  30 year old female with trauma to the right upper extremity. EXAM: RIGHT WRIST - COMPLETE 3+ VIEW; RIGHT FOREARM - 2 VIEW COMPARISON:  None. FINDINGS: There is no evidence of fracture or dislocation. There is no evidence of arthropathy or other focal bone abnormality. Soft tissues are unremarkable. IMPRESSION: Negative. Electronically Signed   By: Anner Crete M.D.   On: 04/19/2016 22:40   Dg Wrist Complete Right  04/19/2016  CLINICAL DATA:  30 year old female with trauma to the right upper extremity. EXAM: RIGHT WRIST - COMPLETE 3+ VIEW; RIGHT FOREARM - 2 VIEW COMPARISON:  None. FINDINGS: There is no evidence of fracture or dislocation. There is no evidence of arthropathy or other focal bone abnormality. Soft tissues are unremarkable. IMPRESSION: Negative. Electronically Signed   By:  Anner Crete M.D.   On: 04/19/2016 22:40   Dg Hand Complete Right  04/19/2016  CLINICAL DATA:  Hand pain after striking box of frozen chicken at work today. EXAM: RIGHT HAND - COMPLETE 3+ VIEW COMPARISON:  None. FINDINGS: There is no evidence of fracture or dislocation. There is no evidence of arthropathy or other focal bone abnormality. Soft tissues are unremarkable. IMPRESSION: Negative. Electronically Signed   By: Elon Alas M.D.   On: 04/19/2016 23:44   I have personally reviewed and evaluated these images and lab results as part of my medical decision-making.   EKG Interpretation None      MDM   Final diagnoses:  Right hand pain    29 year old female presents with right hand pain following an injury earlier today. X-rays are all negative for acute fracture. Advised ice, compression, elevation with NSAIDs. Brace and finger splint applied for comfort. One dose of Ibuprofen given in ED. Patient is NAD, non-toxic, with stable VS. Patient is informed of clinical course, understands medical decision making process, and agrees with plan. Opportunity for questions provided and all questions answered. Return precautions given.    Recardo Evangelist, PA-C 04/20/16 [place], MD 04/21/16 [phone]

## 2016-04-22 ENCOUNTER — Encounter (HOSPITAL_COMMUNITY): Payer: Self-pay | Admitting: *Deleted

## 2016-04-22 ENCOUNTER — Ambulatory Visit (HOSPITAL_COMMUNITY)
Admission: EM | Admit: 2016-04-22 | Discharge: 2016-04-22 | Disposition: A | Discharge status: Home / Self Care | Payer: Self-pay | Attending: Family Medicine | Admitting: Family Medicine

## 2016-04-22 DIAGNOSIS — S6991XA Unspecified injury of right wrist, hand and finger(s), initial encounter: Secondary | ICD-10-CM

## 2016-04-22 MED ORDER — TRAMADOL HCL 50 MG PO TABS: 50.0000 mg | ORAL_TABLET | Freq: Four times a day (QID) | ORAL | Status: DC | PRN

## 2016-04-22 NOTE — ED Notes (Signed)
Medium   Arm  Sling  r

## 2016-04-22 NOTE — ED Provider Notes (Signed)
CSN: [id number]     Arrival date & time 04/22/16  1232 History   First MD Initiated Contact with Patient 04/22/16 1329     Chief Complaint  Patient presents with  . Follow-up   (Consider location/radiation/quality/duration/timing/severity/associated sxs/prior Treatment) HPI History obtained from patient:  Pt presents with the cc of:  Right hand injury/pain Duration of symptoms: 2 days Treatment prior to arrival: Was seen in the emergency department x-rays were done and a splint applied to the hand Context: Sensation states that she had been at work became angry and punched a box of frozen chicken with her right hand. Now has continued pain and this is now radiating up her arm. She states that she taken Tylenol and ibuprofen for pain without any great relief of symptoms she also complains of swelling of the right hand. Other symptoms include: Bruising of the upper arm Pain score: 6 FAMILY HISTORY: Hypertension    Past Medical History  Diagnosis Date  . Asthma    History reviewed. No pertinent past surgical history. History reviewed. No pertinent family history. Social History  Substance Use Topics  . Smoking status: Current Every Day Smoker  . Smokeless tobacco: None  . Alcohol Use: Yes   OB History    No data available     Review of Systems  Denies: HEADACHE, NAUSEA, ABDOMINAL PAIN, CHEST PAIN, CONGESTION, DYSURIA, SHORTNESS OF BREATH  Allergies  Review of patient's allergies indicates no known allergies.  Home Medications   Prior to Admission medications   Medication Sig Start Date End Date Taking? Authorizing Provider  ALBUTEROL IN Inhale into the lungs.   Yes Historical Provider, MD  traMADol (ULTRAM) 50 MG tablet Take 1 tablet (50 mg total) by mouth every 6 (six) hours as needed. 04/22/16   Konrad Felix, PA   Meds Ordered and Administered this Visit  Medications - No data to display  BP 125/83 mmHg  Pulse 72  Temp(Src) 98.6 F (37 C) (Oral)  Resp 18   SpO2 100%  LMP 04/19/2016 No data found.   Physical Exam NURSES NOTES AND VITAL SIGNS REVIEWED. CONSTITUTIONAL: Well developed, well nourished, no acute distress HEENT: normocephalic, atraumatic EYES: Conjunctiva normal NECK:normal ROM, supple, no adenopathy PULMONARY:No respiratory distress, normal effort ABDOMINAL: Soft, ND, NT BS+, No CVAT MUSCULOSKELETAL: Normal ROM of all extremities, Right hand is without swelling or visible deformity. There is some tenderness noted along the third MCP. Range of motion of the right hand is intact. Right wrist movement is intact as is the elbow. SKIN: warm and dry without rash PSYCHIATRIC: Mood and affect, behavior are normal  ED Course  Procedures (including critical care time)  Labs Review Labs Reviewed - No data to display  Imaging Review No results found.   Visual Acuity Review  Right Eye Distance:   Left Eye Distance:   Bilateral Distance:    Right Eye Near:   Left Eye Near:    Bilateral Near:         MDM   1. Hand injury, right, initial encounter     Patient is reassured that there are no issues that require transfer to higher level of care at this time or additional tests. Patient is advised to continue home symptomatic treatment. Patient is advised that if there are new or worsening symptoms to attend the emergency department, contact primary care provider, or return to UC. Instructions of care provided discharged home in stable condition.    THIS NOTE WAS GENERATED USING A VOICE  RECOGNITION SOFTWARE PROGRAM. ALL REASONABLE EFFORTS  WERE MADE TO PROOFREAD THIS DOCUMENT FOR ACCURACY.  I have verbally reviewed the discharge instructions with the patient. A printed AVS was given to the patient.  All questions were answered prior to discharge.      Konrad Felix, PA 04/22/16 1357

## 2016-04-22 NOTE — ED Notes (Signed)
Pt  States        She   Was  Seen  [facility]  3  Days  Ago  For  Hand  Injury     -  Pt    Has  A  Splint  On  r wrist  And  Hand          She  States  She  Now  Has  Pain  And  Swelling  To  r  Upper  Arm       Which  She  States  Was  Not    X  Rayed        -    Pt  States    Continues  To  Have  Pain in  r hand  As  Well

## 2016-04-22 NOTE — Discharge Instructions (Signed)

## 2016-05-17 ENCOUNTER — Inpatient Hospital Stay (HOSPITAL_COMMUNITY)
Admission: AD | Admit: 2016-05-17 | Discharge: 2016-05-17 | Disposition: A | Discharge status: Home / Self Care | Payer: Self-pay | Source: Ambulatory Visit | Attending: Obstetrics & Gynecology | Admitting: Obstetrics & Gynecology

## 2016-05-17 ENCOUNTER — Encounter (HOSPITAL_COMMUNITY): Payer: Self-pay | Admitting: *Deleted

## 2016-05-17 DIAGNOSIS — Z3202 Encounter for pregnancy test, result negative: Secondary | ICD-10-CM | POA: Insufficient documentation

## 2016-05-17 DIAGNOSIS — M545 Low back pain, unspecified: Secondary | ICD-10-CM

## 2016-05-17 DIAGNOSIS — R519 Headache, unspecified: Secondary | ICD-10-CM

## 2016-05-17 DIAGNOSIS — R110 Nausea: Secondary | ICD-10-CM | POA: Insufficient documentation

## 2016-05-17 DIAGNOSIS — J45909 Unspecified asthma, uncomplicated: Secondary | ICD-10-CM | POA: Insufficient documentation

## 2016-05-17 DIAGNOSIS — R51 Headache: Secondary | ICD-10-CM | POA: Insufficient documentation

## 2016-05-17 DIAGNOSIS — A499 Bacterial infection, unspecified: Secondary | ICD-10-CM

## 2016-05-17 DIAGNOSIS — B9689 Other specified bacterial agents as the cause of diseases classified elsewhere: Secondary | ICD-10-CM

## 2016-05-17 DIAGNOSIS — N898 Other specified noninflammatory disorders of vagina: Secondary | ICD-10-CM | POA: Insufficient documentation

## 2016-05-17 DIAGNOSIS — N760 Acute vaginitis: Secondary | ICD-10-CM | POA: Insufficient documentation

## 2016-05-17 DIAGNOSIS — Z113 Encounter for screening for infections with a predominantly sexual mode of transmission: Secondary | ICD-10-CM | POA: Insufficient documentation

## 2016-05-17 DIAGNOSIS — F17200 Nicotine dependence, unspecified, uncomplicated: Secondary | ICD-10-CM | POA: Insufficient documentation

## 2016-05-17 LAB — CBC
HCT: 34.3 % — ABNORMAL LOW (ref 36.0–46.0)
Hemoglobin: 11.7 g/dL — ABNORMAL LOW (ref 12.0–15.0)
MCH: 31 pg (ref 26.0–34.0)
MCHC: 34.1 g/dL (ref 30.0–36.0)
MCV: 90.7 fL (ref 78.0–100.0)
PLATELETS: 299 10*3/uL (ref 150–400)
RBC: 3.78 MIL/uL — AB (ref 3.87–5.11)
RDW: 13.1 % (ref 11.5–15.5)
WBC: 5.7 10*3/uL (ref 4.0–10.5)

## 2016-05-17 LAB — URINALYSIS, ROUTINE W REFLEX MICROSCOPIC
Bilirubin Urine: NEGATIVE
Glucose, UA: NEGATIVE mg/dL
Hgb urine dipstick: NEGATIVE
KETONES UR: NEGATIVE mg/dL
LEUKOCYTES UA: NEGATIVE
NITRITE: NEGATIVE
PROTEIN: NEGATIVE mg/dL
Specific Gravity, Urine: 1.02 (ref 1.005–1.030)
pH: 6 (ref 5.0–8.0)

## 2016-05-17 LAB — WET PREP, GENITAL
Sperm: NONE SEEN
Trich, Wet Prep: NONE SEEN
WBC WET PREP: NONE SEEN
YEAST WET PREP: NONE SEEN

## 2016-05-17 LAB — POCT PREGNANCY, URINE: Preg Test, Ur: NEGATIVE

## 2016-05-17 MED ORDER — ONDANSETRON 8 MG PO TBDP
8.0000 mg | ORAL_TABLET | Freq: Once | ORAL | Status: AC
  Administered 2016-05-17: 8 mg via ORAL
  Filled 2016-05-17: qty 1

## 2016-05-17 MED ORDER — ACETAMINOPHEN 500 MG PO TABS
1000.0000 mg | ORAL_TABLET | Freq: Once | ORAL | Status: AC
  Administered 2016-05-17: 1000 mg via ORAL
  Filled 2016-05-17: qty 2

## 2016-05-17 MED ORDER — PROMETHAZINE HCL 12.5 MG PO TABS
12.5000 mg | ORAL_TABLET | Freq: Four times a day (QID) | ORAL | 0 refills | Status: DC | PRN
Start: 2016-05-17 — End: 2016-06-11

## 2016-05-17 MED ORDER — METRONIDAZOLE 500 MG PO TABS: 500.0000 mg | ORAL_TABLET | Freq: Two times a day (BID) | ORAL | 0 refills | Status: DC

## 2016-05-17 NOTE — MAU Provider Note (Signed)
History     CSN: [id number]  Arrival date and time: 05/17/16 1217   First Provider Initiated Contact with Patient 05/17/16 1345      Chief Complaint  Patient presents with  . Headache  . Possible Pregnancy  . Back Pain  . Vaginal Discharge   HPI  Katherine Wu is a 30 y.o. female who presents with multiple complaints. Initially went downstairs to [facility] for pregnancy verification but when found out they only do a urine pregnancy test she came here. Has taken several pregnancy tests at home that were all negative.  LMP 05/10/16; shorter than normal for her. No contraception.  Headache for the last 3 days. Frontal, dull headache that she rates 10/10. Has not taken any medication for pain. Headache relieved by sleeping. Denies aggravating factors.  Nausea x 1 week. Denies vomiting. Denies abdominal pain, diarrhea, or constipation.  Vaginal discharge x 1 week. Doesn't know what discharge looks like but does note foul odor. Denies vaginal irritation, vaginal bleeding, dyspareunia, or postcoital bleeding.  Also reports lower back pain x 3 days. Constant pain. Worse with standing for long periods. Reports standing for 8+ hours at a time at work. Has not treated pain. Denies perianal numbness, or fecal/urinary incontinence.  Desires STD testing today.   OB History    Gravida Para Term Preterm AB Living   0 0 0 0 0 0   SAB TAB Ectopic Multiple Live Births   0 0 0 0 0      Past Medical History:  Diagnosis Date  . Asthma     History reviewed. No pertinent surgical history.  History reviewed. No pertinent family history.  Social History  Substance Use Topics  . Smoking status: Current Every Day Smoker  . Smokeless tobacco: Current User  . Alcohol use Yes    Allergies: No Known Allergies  Prescriptions Prior to Admission  Medication Sig Dispense Refill Last Dose  . ALBUTEROL IN Inhale into the lungs.     . traMADol (ULTRAM) 50 MG tablet Take 1 tablet (50 mg total) by mouth  every 6 (six) hours as needed. 15 tablet 0     Review of Systems  Constitutional: Negative for chills, fever and weight loss.  Eyes: Negative.   Gastrointestinal: Positive for nausea. Negative for abdominal pain, constipation, diarrhea and vomiting.  Genitourinary: Positive for frequency. Negative for dysuria, flank pain and hematuria.       + vaginal discharge No vaginal bleeding, dyspareunia, or postcoital bleeding  Musculoskeletal: Positive for back pain. Negative for falls and myalgias.  Neurological: Positive for headaches.   Physical Exam   Blood pressure 130/86, pulse (!) 53, temperature 98.3 F (36.8 C), temperature source Oral, resp. rate 18, weight 173 lb (78.5 kg), last menstrual period 05/10/2016.  Physical Exam  Nursing note and vitals reviewed. Constitutional: She is oriented to person, place, and time. She appears well-developed and well-nourished. No distress.  HENT:  Head: Normocephalic and atraumatic.  Eyes: Conjunctivae are normal. Right eye exhibits no discharge. Left eye exhibits no discharge. No scleral icterus.  Neck: Normal range of motion.  Cardiovascular: Normal rate, regular rhythm and normal heart sounds.   No murmur heard. Respiratory: Effort normal and breath sounds normal. No respiratory distress. She has no wheezes.  GI: Soft. She exhibits no distension. There is no tenderness. There is no rebound and no guarding.  Genitourinary: Uterus normal. Cervix exhibits no motion tenderness. Right adnexum displays no mass and no tenderness. Left adnexum displays  no mass and no tenderness.  Neurological: She is alert and oriented to person, place, and time.  Skin: Skin is warm and dry. She is not diaphoretic.  Psychiatric: She has a normal mood and affect. Her behavior is normal. Judgment and thought content normal.    MAU Course  Procedures Results for orders placed or performed during the hospital encounter of 05/17/16 (from the past 24 hour(s))   Urinalysis, Routine w reflex microscopic (not at [facility])     Status: None   Collection Time: 05/17/16 12:30 PM  Result Value Ref Range   Color, Urine YELLOW YELLOW   APPearance CLEAR CLEAR   Specific Gravity, Urine 1.020 1.005 - 1.030   pH 6.0 5.0 - 8.0   Glucose, UA NEGATIVE NEGATIVE mg/dL   Hgb urine dipstick NEGATIVE NEGATIVE   Bilirubin Urine NEGATIVE NEGATIVE   Ketones, ur NEGATIVE NEGATIVE mg/dL   Protein, ur NEGATIVE NEGATIVE mg/dL   Nitrite NEGATIVE NEGATIVE   Leukocytes, UA NEGATIVE NEGATIVE  Pregnancy, urine POC     Status: None   Collection Time: 05/17/16 12:42 PM  Result Value Ref Range   Preg Test, Ur NEGATIVE NEGATIVE  CBC     Status: Abnormal   Collection Time: 05/17/16  2:33 PM  Result Value Ref Range   WBC 5.7 4.0 - 10.5 K/uL   RBC 3.78 (L) 3.87 - 5.11 MIL/uL   Hemoglobin 11.7 (L) 12.0 - 15.0 g/dL   HCT 34.3 (L) 36.0 - 46.0 %   MCV 90.7 78.0 - 100.0 fL   MCH 31.0 26.0 - 34.0 pg   MCHC 34.1 30.0 - 36.0 g/dL   RDW 13.1 11.5 - 15.5 %   Platelets 299 150 - 400 K/uL  Wet prep, genital     Status: Abnormal   Collection Time: 05/17/16  2:58 PM  Result Value Ref Range   Yeast Wet Prep HPF POC NONE SEEN NONE SEEN   Trich, Wet Prep NONE SEEN NONE SEEN   Clue Cells Wet Prep HPF POC PRESENT (A) NONE SEEN   WBC, Wet Prep HPF POC NONE SEEN NONE SEEN   Sperm NONE SEEN     MDM UPT negative CBC, HIV, RPR, GC/CT, wet prep Zofran 8 mg ODT Tylenol 1 gm PO Pt reports improvement in symptoms Assessment and Plan  A: 1. BV (bacterial vaginosis)   2. Negative pregnancy test   3. Screen for STD (sexually transmitted disease)   4. Midline low back pain without sciatica   5. Acute nonintractable headache, unspecified headache type     P: Discharge home Rx flagyl & phenergan Discussed purpose of MAU and reasons to return here vs ED Contact info for [facility] provided -- contact for primary care GC/CT, HIV, RPR pending   Jorje Guild 05/17/2016,  2:12 PM

## 2016-05-17 NOTE — MAU Note (Signed)
Had this crazy headache for about 3 days.  Feeling weird.  Period came on, usually lasts 7 days, was only 3.  Lower back has been hurting.  Lowe grade fever of 100 the past couple days. Just doesn't feel good.

## 2016-05-17 NOTE — Discharge Instructions (Signed)
General Headache Without Cause A headache is pain or discomfort felt around the head or neck area. There are many causes and types of headaches. In some cases, the cause may not be found.  HOME CARE  Managing Pain  Take over-the-counter and prescription medicines only as told by your doctor.  Lie down in a dark, quiet room when you have a headache.  If directed, apply ice to the head and neck area:  Put ice in a plastic bag.  Place a towel between your skin and the bag.  Leave the ice on for 20 minutes, 2-3 times per day.  Use a heating pad or hot shower to apply heat to the head and neck area as told by your doctor.  Keep lights dim if bright lights bother you or make your headaches worse. Eating and Drinking  Eat meals on a regular schedule.  Lessen how much alcohol you drink.  Lessen how much caffeine you drink, or stop drinking caffeine. General Instructions  Keep all follow-up visits as told by your doctor. This is important.  Keep a journal to find out if certain things bring on headaches. For example, write down:  What you eat and drink.  How much sleep you get.  Any change to your diet or medicines.  Relax by getting a massage or doing other relaxing activities.  Lessen stress.  Sit up straight. Do not tighten (tense) your muscles.  Do not use tobacco products. This includes cigarettes, chewing tobacco, or e-cigarettes. If you need help quitting, ask your doctor.  Exercise regularly as told by your doctor.  Get enough sleep. This often means 7-9 hours of sleep. GET HELP IF:  Your symptoms are not helped by medicine.  You have a headache that feels different than the other headaches.  You feel sick to your stomach (nauseous) or you throw up (vomit).  You have a fever. GET HELP RIGHT AWAY IF:   Your headache becomes really bad.  You keep throwing up.  You have a stiff neck.  You have trouble seeing.  You have trouble speaking.  You have  pain in the eye or ear.  Your muscles are weak or you lose muscle control.  You lose your balance or have trouble walking.  You feel like you will pass out (faint) or you pass out.  You have confusion.   This information is not intended to replace advice given to you by your health care provider. Make sure you discuss any questions you have with your health care provider.   Document Released: 07/06/2008 Document Revised: 06/18/2015 Document Reviewed: 01/20/2015 Elsevier Interactive Patient Education 2016 Elsevier Inc.    Bacterial Vaginosis Bacterial vaginosis is an infection of the vagina. It happens when too many germs (bacteria) grow in the vagina. Having this infection puts you at risk for getting other infections from sex. Treating this infection can help lower your risk for other infections, such as:   Chlamydia.  Gonorrhea.  HIV.  Herpes. HOME CARE  Take your medicine as told by your doctor.  Finish your medicine even if you start to feel better.  During treatment:  Avoid sex or use condoms correctly.  Do not douche.  Do not drink alcohol unless your doctor tells you it is ok.  Do not breastfeed unless your doctor tells you it is ok. GET HELP IF:  You are not getting better after 3 days of treatment.  You have more grey fluid (discharge) coming from your vagina than  before.  You have more pain than before.  You have a fever. MAKE SURE YOU:   Understand these instructions.  Will watch your condition.  Will get help right away if you are not doing well or get worse.   This information is not intended to replace advice given to you by your health care provider. Make sure you discuss any questions you have with your health care provider.   Document Released: 07/06/2008 Document Revised: 10/18/2014 Document Reviewed: 05/09/2013 Elsevier Interactive Patient Education 2016 Elsevier Inc.     Back Pain, Adult Back pain is very common. The pain often  gets better over time. The cause of back pain is usually not dangerous. Most people can learn to manage their back pain on their own.  HOME CARE  Watch your back pain for any changes. The following actions may help to lessen any pain you are feeling:  Stay active. Start with short walks on flat ground if you can. Try to walk farther each day.  Exercise regularly as told by your doctor. Exercise helps your back heal faster. It also helps avoid future injury by keeping your muscles strong and flexible.  Do not sit, drive, or stand in one place for more than 30 minutes.  Do not stay in bed. Resting more than 1-2 days can slow down your recovery.  Be careful when you bend or lift an object. Use good form when lifting:  Bend at your knees.  Keep the object close to your body.  Do not twist.  Sleep on a firm mattress. Lie on your side, and bend your knees. If you lie on your back, put a pillow under your knees.  Take medicines only as told by your doctor.  Put ice on the injured area.  Put ice in a plastic bag.  Place a towel between your skin and the bag.  Leave the ice on for 20 minutes, 2-3 times a day for the first 2-3 days. After that, you can switch between ice and heat packs.  Avoid feeling anxious or stressed. Find good ways to deal with stress, such as exercise.  Maintain a healthy weight. Extra weight puts stress on your back. GET HELP IF:   You have pain that does not go away with rest or medicine.  You have worsening pain that goes down into your legs or buttocks.  You have pain that does not get better in one week.  You have pain at night.  You lose weight.  You have a fever or chills. GET HELP RIGHT AWAY IF:   You cannot control when you poop (bowel movement) or pee (urinate).  Your arms or legs feel weak.  Your arms or legs lose feeling (numbness).  You feel sick to your stomach (nauseous) or throw up (vomit).  You have belly (abdominal) pain.  You  feel like you may pass out (faint).   This information is not intended to replace advice given to you by your health care provider. Make sure you discuss any questions you have with your health care provider.   Document Released: 03/15/2008 Document Revised: 10/18/2014 Document Reviewed: 01/29/2014 Elsevier Interactive Patient Education [employer].

## 2016-05-17 NOTE — MAU Note (Cosign Needed)
History     CSN: [id number]  Arrival date and time: 05/17/16 1217  Katherine Wu is a 30 y.o. female who presents with several weeks of headaches, lower back pain, fatigue, increased appetite, and urinary frequency. She also reports "low grade fevers," as high as 100 per patient's report.  She has had a constant headache for the past 3 days, which is better with sleep and sometimes in the morning. There is associated nausea and lightheadedness, with occasional emesis. No headache red flags including vision changes, neuro symptoms, or abrupt onset.   She also reports urinary frequency and mild vaginal discharge that is malodorous. She is sexually active with both men and women and most recently had unprotected intercourse 2-3 weeks ago with a man. She has taken several home pregnancy tests which have been negative. She is not actively trying to become pregnant but says "it would be nice."  She smokes about 1 cigarette/day. Denies alcohol or other substance use.  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  Chief Complaint  Patient presents with  . Headache   Headache   Associated symptoms include abdominal pain, back pain, a fever, nausea and vomiting.    Past Medical History:  Diagnosis Date  . Asthma     No past surgical history on file.  No family history on file.  Social History  Substance Use Topics  . Smoking status: Current Every Day Smoker  . Smokeless tobacco: Not on file  . Alcohol use Yes    Allergies: No Known Allergies  Prescriptions Prior to Admission  Medication Sig Dispense Refill Last Dose  . ALBUTEROL IN Inhale into the lungs.     . traMADol (ULTRAM) 50 MG tablet Take 1 tablet (50 mg total) by mouth every 6 (six) hours as needed. 15 tablet 0     Review of Systems  Constitutional: Positive for fever and malaise/fatigue.  Gastrointestinal: Positive for abdominal pain, nausea and vomiting.  Genitourinary: Positive for frequency.  Musculoskeletal:  Positive for back pain.  Neurological: Positive for headaches.   Physical Exam   Blood pressure 122/89, pulse 60, temperature 98.3 F (36.8 C), temperature source Oral, resp. rate 18, weight 78.5 kg (173 lb), last menstrual period 05/10/2016.  Physical Exam  Constitutional: She is oriented to person, place, and time.  NAD, but she does occasionally tear up when expressing frustration about her symptoms.   Cardiovascular: Normal rate and regular rhythm.  Exam reveals no gallop and no friction rub.   No murmur heard. Respiratory: Effort normal and breath sounds normal.  GI: Soft. Bowel sounds are normal.  Genitourinary: Uterus normal. Vaginal discharge found.  Neurological: She is alert and oriented to person, place, and time.    MAU Course  Procedures  MDM Urine pregnancy test was negative. Urinalysis was also negative, with no signs of infection. Bimanual exam normal without cervical motion tenderness. Wet prep showed clue cells. CBC showed slight anemia with Hgb/Hct of 11.7/34.3. STI testing ordered.  Assessment and Plan  Headaches, N/V, fatigue: Patient has had constant daily headaches over the past 3 days, but has no red flag symptoms including vision changes, abrupt onset, or neurologic signs. Most likely tension vs migraine headache, though anemia could also be contributing to fatigue. -Recommended NSAIDs or acetaminophen for headaches. Discussed that these medications are safe to take at this point, though she would want to stop NSAIDs if pregnant. -Prescribed Phenergan to use as needed for nausea or vomiting.  Urinary symptoms: Patient  endorses urinary frequency and increased amount of malodorous vaginal discharge. She also has multiple partners and does not use protection. Urine pregnancy and urinalysis negative. Bimanual exam normal without evidence of cervical motion tenderness. Wet prep showed clue cells.  -Will treat for bacterial vaginosis with metronidazole 500 mg BID x 7  days. -STI testing ordered including HIV, G/C, Syphilis. Will f/u with results. -Discussed limited utility of blood test for pregnancy, given negative urine. She can take weekly pregnancy tests as desired.  Lorenza Evangelist 05/17/2016, 12:40 PM

## 2016-05-18 LAB — GC/CHLAMYDIA PROBE AMP (~~LOC~~) NOT AT ARMC
Chlamydia: NEGATIVE
NEISSERIA GONORRHEA: NEGATIVE

## 2016-05-18 LAB — HIV ANTIBODY (ROUTINE TESTING W REFLEX): HIV SCREEN 4TH GENERATION: NONREACTIVE

## 2016-05-18 LAB — RPR: RPR: NONREACTIVE

## 2016-06-11 ENCOUNTER — Encounter (HOSPITAL_COMMUNITY): Payer: Self-pay | Admitting: *Deleted

## 2016-06-11 ENCOUNTER — Inpatient Hospital Stay (HOSPITAL_COMMUNITY): Payer: Self-pay

## 2016-06-11 ENCOUNTER — Inpatient Hospital Stay (HOSPITAL_COMMUNITY)
Admission: AD | Admit: 2016-06-11 | Discharge: 2016-06-11 | Disposition: A | Discharge status: Home / Self Care | Payer: Self-pay | Source: Ambulatory Visit | Attending: Obstetrics & Gynecology | Admitting: Obstetrics & Gynecology

## 2016-06-11 DIAGNOSIS — N939 Abnormal uterine and vaginal bleeding, unspecified: Secondary | ICD-10-CM

## 2016-06-11 DIAGNOSIS — N852 Hypertrophy of uterus: Secondary | ICD-10-CM | POA: Insufficient documentation

## 2016-06-11 DIAGNOSIS — R102 Pelvic and perineal pain: Secondary | ICD-10-CM | POA: Insufficient documentation

## 2016-06-11 DIAGNOSIS — D259 Leiomyoma of uterus, unspecified: Secondary | ICD-10-CM

## 2016-06-11 LAB — URINALYSIS, ROUTINE W REFLEX MICROSCOPIC
BILIRUBIN URINE: NEGATIVE
Glucose, UA: NEGATIVE mg/dL
Hgb urine dipstick: NEGATIVE
KETONES UR: NEGATIVE mg/dL
Leukocytes, UA: NEGATIVE
NITRITE: NEGATIVE
Protein, ur: NEGATIVE mg/dL
Specific Gravity, Urine: >1.03 — ABNORMAL HIGH (ref 1.005–1.030)
pH: 5.5 (ref 5.0–8.0)

## 2016-06-11 LAB — CBC
HCT: 34.5 % — ABNORMAL LOW (ref 36.0–46.0)
HEMOGLOBIN: 11.8 g/dL — AB (ref 12.0–15.0)
MCH: 31.1 pg (ref 26.0–34.0)
MCHC: 34.2 g/dL (ref 30.0–36.0)
MCV: 91 fL (ref 78.0–100.0)
PLATELETS: 252 10*3/uL (ref 150–400)
RBC: 3.79 MIL/uL — AB (ref 3.87–5.11)
RDW: 12.8 % (ref 11.5–15.5)
WBC: 5 10*3/uL (ref 4.0–10.5)

## 2016-06-11 LAB — POCT PREGNANCY, URINE: PREG TEST UR: NEGATIVE

## 2016-06-11 LAB — ABO/RH: ABO/RH(D): A POS

## 2016-06-11 LAB — HCG, QUANTITATIVE, PREGNANCY

## 2016-06-11 MED ORDER — PROMETHAZINE HCL 25 MG PO TABS: 12.5000 mg | ORAL_TABLET | Freq: Four times a day (QID) | ORAL | 0 refills | Status: DC | PRN

## 2016-06-11 NOTE — MAU Note (Signed)
Patient reports not feeling well x 2 months, positive UPTs at home, fatigue, cramping, irregular periods LMP 05/31/16 was supposed to be end of the month bled for 3 days, nausea.

## 2016-06-11 NOTE — MAU Provider Note (Signed)
Chief Complaint: No chief complaint on file.   First Provider Initiated Contact with Patient 06/11/16 1636      SUBJECTIVE HPI: Katherine Wu is a 30 y.o. [id number] who presents to maternity admissions reporting irregular menses, abdominal cramping, nausea, and fatigue x 2 months. She had MAU visit on 8/7 with STD testing and labwork but symptoms have not changed.  She denies any recent exposure to STDs.  She reports she has an Rx for Phenergan but has not picked it up. She has not tried anything for her symptoms and nothing makes them better or worse. She reports 2 positive HPTs approximately 1 month ago.  Patient's last menstrual period was 05/31/2016.  Bleeding was unusually short at only 3 days instead of her usual 7.  Prior to 2 months ago her periods were regular without complication.She lost her insurance so has not had regular Gyn care in an office. She denies vaginal bleeding, vaginal itching/burning, urinary symptoms, h/a, dizziness, n/v, or fever/chills.     HPI  Past Medical History:  Diagnosis Date  . Asthma    Past Surgical History:  Procedure Laterality Date  . NO PAST SURGERIES     Social History   Social History  . Marital status: Single    Spouse name: N/A  . Number of children: N/A  . Years of education: N/A   Occupational History  . Not on file.   Social History Main Topics  . Smoking status: Former Smoker    Quit date: 04/25/2016  . Smokeless tobacco: Never Used  . Alcohol use No  . Drug use: No  . Sexual activity: Yes   Other Topics Concern  . Not on file   Social History Narrative  . No narrative on file   No current facility-administered medications on file prior to encounter.    Current Outpatient Prescriptions on File Prior to Encounter  Medication Sig Dispense Refill  . metroNIDAZOLE (FLAGYL) 500 MG tablet Take 1 tablet (500 mg total) by mouth 2 (two) times daily. (Patient not taking: Reported on 06/11/2016) 14 tablet 0  . promethazine  (PHENERGAN) 12.5 MG tablet Take 1 tablet (12.5 mg total) by mouth every 6 (six) hours as needed for nausea or vomiting. (Patient not taking: Reported on 06/11/2016) 30 tablet 0  . [DISCONTINUED] albuterol (PROVENTIL HFA;VENTOLIN HFA) 108 (90 BASE) MCG/ACT inhaler Inhale 1-2 puffs into the lungs every 6 (six) hours as needed for wheezing. (Patient not taking: Reported on 04/26/2015) 1 Inhaler 1   No Known Allergies  ROS:  Review of Systems  Constitutional: Negative for chills, fatigue and fever.  Respiratory: Negative for shortness of breath.   Cardiovascular: Negative for chest pain.  Gastrointestinal: Positive for nausea. Negative for vomiting.  Genitourinary: Positive for pelvic pain. Negative for difficulty urinating, dysuria, flank pain, vaginal discharge and vaginal pain.  Neurological: Negative for dizziness and headaches.  Psychiatric/Behavioral: Negative.      I have reviewed patient's Past Medical Hx, Surgical Hx, Family Hx, Social Hx, medications and allergies.   Physical Exam  Patient Vitals for the past 24 hrs:  BP Temp Temp src Pulse Resp Height Weight  06/11/16 1400 127/81 97.8 F (36.6 C) Oral 66 18 5\' 10"  (1.778 m) 179 lb (81.2 kg)   Constitutional: Well-developed, well-nourished female in no acute distress.  Cardiovascular: normal rate Respiratory: normal effort GI: Abd soft, non-tender. Pos BS x 4 MS: Extremities nontender, no edema, normal ROM Neurologic: Alert and oriented x 4.  GU: Neg CVAT.  PELVIC EXAM: Deferred--done 05/17/16    LAB RESULTS Results for orders placed or performed during the hospital encounter of 06/11/16 (from the past 24 hour(s))  Pregnancy, urine POC     Status: None   Collection Time: 06/11/16  2:08 PM  Result Value Ref Range   Preg Test, Ur NEGATIVE NEGATIVE  CBC     Status: Abnormal   Collection Time: 06/11/16  2:44 PM  Result Value Ref Range   WBC 5.0 4.0 - 10.5 K/uL   RBC 3.79 (L) 3.87 - 5.11 MIL/uL   Hemoglobin 11.8 (L) 12.0 -  15.0 g/dL   HCT 34.5 (L) 36.0 - 46.0 %   MCV 91.0 78.0 - 100.0 fL   MCH 31.1 26.0 - 34.0 pg   MCHC 34.2 30.0 - 36.0 g/dL   RDW 12.8 11.5 - 15.5 %   Platelets 252 150 - 400 K/uL  hCG, quantitative, pregnancy     Status: None   Collection Time: 06/11/16  2:44 PM  Result Value Ref Range   hCG, Beta Chain, Quant, S <1 <5 mIU/mL  ABO/Rh     Status: None (Preliminary result)   Collection Time: 06/11/16  2:44 PM  Result Value Ref Range   ABO/RH(D) A POS     --/--/A POS (09/01 1444)  IMAGING No results found. US Transvaginal Non-ob  Result Date: 06/11/2016 CLINICAL DATA:  Pelvic pain and cramping, fatigue, irregular menses, diabetes mellitus, hypertension ; patient indicates positive pregnancy test at home but quantitative beta HCG < 1 EXAM: TRANSABDOMINAL AND TRANSVAGINAL ULTRASOUND OF PELVIS TECHNIQUE: Both transabdominal and transvaginal ultrasound examinations of the pelvis were performed. Transabdominal technique was performed for global imaging of the pelvis including uterus, ovaries, adnexal regions, and pelvic cul-de-sac. It was necessary to proceed with endovaginal exam following the transabdominal exam to visualize the uterus. COMPARISON:  None FINDINGS: Uterus Measurements: 13.2 x 7.8 x 6.8 cm. Retroverted. Multiple masses likely representing multiple uterine leiomyomata. Large exophytic myoma at fundus 5.9 x 4.9 x 5.2 cm. Additional fundal leiomyoma 3.2 x 3.0 x 3.4 cm. Smaller mid uterine masses. Endometrium Thickness: 12 mm thick.  No endometrial fluid or focal abnormality Right ovary Measurements: 3.4 x 2.3 x 3.6 cm. Probable small hemorrhagic corpus luteal cyst. Blood flow present within RIGHT ovary on color Doppler imaging. Left ovary Measurements: 3.7 x 2.9 x 2.9 cm. Normal morphology without mass. Internal blood flow present on color Doppler imaging. Other findings No free pelvic fluid or adnexal masses. IMPRESSION: Enlarged uterus containing multiple probable uterine leiomyomata up  to 5.9 cm in greatest size. No additional significant sonographic abnormalities identified. Electronically Signed   By: Lavonia Dana M.D.   On: 06/11/2016 18:26   US Pelvis Complete  Result Date: 06/11/2016 CLINICAL DATA:  Pelvic pain and cramping, fatigue, irregular menses, diabetes mellitus, hypertension ; patient indicates positive pregnancy test at home but quantitative beta HCG < 1 EXAM: TRANSABDOMINAL AND TRANSVAGINAL ULTRASOUND OF PELVIS TECHNIQUE: Both transabdominal and transvaginal ultrasound examinations of the pelvis were performed. Transabdominal technique was performed for global imaging of the pelvis including uterus, ovaries, adnexal regions, and pelvic cul-de-sac. It was necessary to proceed with endovaginal exam following the transabdominal exam to visualize the uterus. COMPARISON:  None FINDINGS: Uterus Measurements: 13.2 x 7.8 x 6.8 cm. Retroverted. Multiple masses likely representing multiple uterine leiomyomata. Large exophytic myoma at fundus 5.9 x 4.9 x 5.2 cm. Additional fundal leiomyoma 3.2 x 3.0 x 3.4 cm. Smaller mid uterine masses. Endometrium Thickness: 12 mm thick.  No endometrial fluid or focal abnormality Right ovary Measurements: 3.4 x 2.3 x 3.6 cm. Probable small hemorrhagic corpus luteal cyst. Blood flow present within RIGHT ovary on color Doppler imaging. Left ovary Measurements: 3.7 x 2.9 x 2.9 cm. Normal morphology without mass. Internal blood flow present on color Doppler imaging. Other findings No free pelvic fluid or adnexal masses. IMPRESSION: Enlarged uterus containing multiple probable uterine leiomyomata up to 5.9 cm in greatest size. No additional significant sonographic abnormalities identified. Electronically Signed   By: Lavonia Dana M.D.   On: 06/11/2016 18:26   MAU Management/MDM: Ordered labs and reviewed results.  No acute findings today. Fibroid uterus. Quant hcg <1 indicates no recent pregnancy. F/U in [facility] for Gyn care and with primary care provider.  Will  treat nausea with phenergan 12.5-25 mg PO Q 6 hours PRN. Ibuprofen for pain. Pt stable at time of discharge.  ASSESSMENT  1. Uterine leiomyoma, unspecified location   2. Pelvic pain in female   3. Abnormal uterine bleeding (AUB)     PLAN Discharge home    Fatima Blank Certified Nurse-Midwife 06/11/2016  4:53 PM

## 2016-06-11 NOTE — Discharge Instructions (Signed)
Uterine Fibroids Uterine fibroids are tissue masses (tumors) that can develop in the womb (uterus). They are also called leiomyomas. This type of tumor is not cancerous (benign) and does not spread to other parts of the body outside of the pelvic area, which is between the hip bones. Occasionally, fibroids may develop in the fallopian tubes, in the cervix, or on the support structures (ligaments) that surround the uterus. You can have one or many fibroids. Fibroids can vary in size, weight, and where they grow in the uterus. Some can become quite large. Most fibroids do not require medical treatment. CAUSES A fibroid can develop when a single uterine cell keeps growing (replicating). Most cells in the human body have a control mechanism that keeps them from replicating without control. SIGNS AND SYMPTOMS Symptoms may include:   Heavy bleeding during your period.  Bleeding or spotting between periods.  Pelvic pain and pressure.  Bladder problems, such as needing to urinate more often (urinary frequency) or urgently.  Inability to reproduce offspring (infertility).  Miscarriages. DIAGNOSIS Uterine fibroids are diagnosed through a physical exam. Your health care provider may feel the lumpy tumors during a pelvic exam. Ultrasonography and an MRI may be done to determine the size, location, and number of fibroids. TREATMENT Treatment may include:  Watchful waiting. This involves getting the fibroid checked by your health care provider to see if it grows or shrinks. Follow your health care provider's recommendations for how often to have this checked.  Hormone medicines. These can be taken by mouth or given through an intrauterine device (IUD).  Surgery.  Removing the fibroids (myomectomy) or the uterus (hysterectomy).  Removing blood supply to the fibroids (uterine artery embolization). If fibroids interfere with your fertility and you want to become pregnant, your health care provider  may recommend having the fibroids removed.  HOME CARE INSTRUCTIONS  Keep all follow-up visits as directed by your health care provider. This is important.  Take medicines only as directed by your health care provider.  If you were prescribed a hormone treatment, take the hormone medicines exactly as directed.  Do not take aspirin, because it can cause bleeding.  Ask your health care provider about taking iron pills and increasing the amount of dark green, leafy vegetables in your diet. These actions can help to boost your blood iron levels, which may be affected by heavy menstrual bleeding.  Pay close attention to your period and tell your health care provider about any changes, such as:  Increased blood flow that requires you to use more pads or tampons than usual per month.  A change in the number of days that your period lasts per month.  A change in symptoms that are associated with your period, such as abdominal cramping or back pain. SEEK MEDICAL CARE IF:  You have pelvic pain, back pain, or abdominal cramps that cannot be controlled with medicines.  You have an increase in bleeding between and during periods.  You soak tampons or pads in a half hour or less.  You feel lightheaded, extra tired, or weak. SEEK IMMEDIATE MEDICAL CARE IF:  You faint.  You have a sudden increase in pelvic pain.   This information is not intended to replace advice given to you by your health care provider. Make sure you discuss any questions you have with your health care provider.   Document Released: 09/24/2000 Document Revised: 10/18/2014 Document Reviewed: 03/26/2014 Elsevier Interactive Patient Education 2016 Elsevier Inc.   Abnormal Uterine Bleeding Abnormal uterine  bleeding can affect women at various stages in life, including teenagers, women in their reproductive years, pregnant women, and women who have reached menopause. Several kinds of uterine bleeding are considered abnormal,  including:  Bleeding or spotting between periods.   Bleeding after sexual intercourse.   Bleeding that is heavier or more than normal.   Periods that last longer than usual.  Bleeding after menopause.  Many cases of abnormal uterine bleeding are minor and simple to treat, while others are more serious. Any type of abnormal bleeding should be evaluated by your health care provider. Treatment will depend on the cause of the bleeding. HOME CARE INSTRUCTIONS Monitor your condition for any changes. The following actions may help to alleviate any discomfort you are experiencing:  Avoid the use of tampons and douches as directed by your health care provider.  Change your pads frequently. You should get regular pelvic exams and Pap tests. Keep all follow-up appointments for diagnostic tests as directed by your health care provider.  SEEK MEDICAL CARE IF:   Your bleeding lasts more than 1 week.   You feel dizzy at times.  SEEK IMMEDIATE MEDICAL CARE IF:   You pass out.   You are changing pads every 15 to 30 minutes.   You have abdominal pain.  You have a fever.   You become sweaty or weak.   You are passing large blood clots from the vagina.   You start to feel nauseous and vomit. MAKE SURE YOU:   Understand these instructions.  Will watch your condition.  Will get help right away if you are not doing well or get worse.   This information is not intended to replace advice given to you by your health care provider. Make sure you discuss any questions you have with your health care provider.   Document Released: 09/27/2005 Document Revised: 10/02/2013 Document Reviewed: 04/26/2013 Elsevier Interactive Patient Education [employer].

## 2016-06-12 LAB — HIV ANTIBODY (ROUTINE TESTING W REFLEX): HIV SCREEN 4TH GENERATION: NONREACTIVE

## 2016-06-30 ENCOUNTER — Encounter (HOSPITAL_COMMUNITY): Payer: Self-pay | Admitting: Emergency Medicine

## 2016-06-30 ENCOUNTER — Ambulatory Visit (HOSPITAL_COMMUNITY)
Admission: EM | Admit: 2016-06-30 | Discharge: 2016-06-30 | Disposition: A | Discharge status: Home / Self Care | Payer: Self-pay | Attending: Family Medicine | Admitting: Family Medicine

## 2016-06-30 DIAGNOSIS — K0889 Other specified disorders of teeth and supporting structures: Secondary | ICD-10-CM

## 2016-06-30 MED ORDER — HYDROCODONE-ACETAMINOPHEN 5-325 MG PO TABS: 2.0000 | ORAL_TABLET | ORAL | 0 refills | Status: AC | PRN

## 2016-06-30 MED ORDER — PENICILLIN V POTASSIUM 500 MG PO TABS: 500.0000 mg | ORAL_TABLET | Freq: Three times a day (TID) | ORAL | 0 refills | Status: AC

## 2016-06-30 NOTE — ED Provider Notes (Signed)
CSN: [id number]     Arrival date & time 06/30/16  1748 History   First MD Initiated Contact with Patient 06/30/16 1833     Chief Complaint  Patient presents with  . Dental Pain   (Consider location/radiation/quality/duration/timing/severity/associated sxs/prior Treatment) The history is provided by the patient.  Dental Pain  Location:  Upper Upper teeth location:  2/RU 2nd molar Quality:  Throbbing, sharp and aching Pain severity now: 10/10. Onset quality:  Gradual (but worsen today) Duration:  2 weeks (worsen today) Timing:  Constant Progression:  Worsening Chronicity:  New Relieved by:  Nothing Worsened by:  Cold food/drink, touching and jaw movement Ineffective treatments:  Acetaminophen and NSAIDs   Past Medical History:  Diagnosis Date  . Asthma    Past Surgical History:  Procedure Laterality Date  . NO PAST SURGERIES     No family history on file. Social History  Substance Use Topics  . Smoking status: Former Smoker    Quit date: 04/25/2016  . Smokeless tobacco: Never Used  . Alcohol use No   OB History    Gravida Para Term Preterm AB Living   0 0 0 0 0 0   SAB TAB Ectopic Multiple Live Births   0 0 0 0 0     Review of Systems  HENT:       +dental pain  All other systems reviewed and are negative.   Allergies  Review of patient's allergies indicates no known allergies.  Home Medications   Prior to Admission medications   Medication Sig Start Date End Date Taking? Authorizing Provider  HYDROcodone-acetaminophen (NORCO/VICODIN) 5-325 MG tablet Take 2 tablets by mouth every 4 (four) hours as needed. 06/30/16 07/03/16  Barry Dienes, NP  penicillin v potassium (VEETID) 500 MG tablet Take 1 tablet (500 mg total) by mouth 3 (three) times daily. 06/30/16 07/07/16  Barry Dienes, NP  promethazine (PHENERGAN) 25 MG tablet Take 0.5-1 tablets (12.5-25 mg total) by mouth every 6 (six) hours as needed for nausea. 06/11/16   Kathie Dike Leftwich-Kirby, CNM   Meds Ordered and  Administered this Visit  Medications - No data to display  BP 124/80 (BP Location: Left Arm)   Pulse (!) 52   Temp 98.1 F (36.7 C) (Oral)   Resp 16   LMP 05/26/2016 Comment: shorter than usual  SpO2 100%  No data found.   Physical Exam  Constitutional: She is oriented to person, place, and time. She appears well-developed and well-nourished.  HENT:  Head: Normocephalic and atraumatic.  Mouth/Throat:    Pain at right upper 2nd molar, tender to palpate at gumline, gum is red and swollen compared to left side. Tooth noted to be broken  Cardiovascular: Normal rate, regular rhythm and normal heart sounds.   Pulmonary/Chest: Effort normal and breath sounds normal.  Neurological: She is alert and oriented to person, place, and time.  Nursing note and vitals reviewed.   Urgent Care Course   Clinical Course    Procedures (including critical care time)  Labs Review Labs Reviewed - No data to display  Imaging Review No results found.      MDM   1. Pain, dental    Prescriptions given (see above). Reviewed directions for usage and side effects. Patient states understanding and will call with questions or problems. Patient instructed to make an appointment with a dentist as soon as possible. Discharge instruction given. Patient denies any questions. [place] Controlled substances registry reviewed and is appropriate.  Barry Dienes, NP 06/30/16 1845

## 2016-06-30 NOTE — ED Triage Notes (Signed)
Pt. Stated, I've had toothache on the upper right for a few weeks but today I can't get it to stop.

## 2016-07-08 ENCOUNTER — Encounter: Payer: Self-pay | Admitting: Family Medicine

## 2016-07-09 ENCOUNTER — Encounter: Payer: Self-pay | Admitting: Family Medicine

## 2016-11-11 ENCOUNTER — Ambulatory Visit (HOSPITAL_COMMUNITY)
Admission: EM | Admit: 2016-11-11 | Discharge: 2016-11-11 | Disposition: A | Discharge status: Home / Self Care | Payer: Self-pay | Attending: Emergency Medicine | Admitting: Emergency Medicine

## 2016-11-11 ENCOUNTER — Encounter (HOSPITAL_COMMUNITY): Payer: Self-pay | Admitting: *Deleted

## 2016-11-11 DIAGNOSIS — K0889 Other specified disorders of teeth and supporting structures: Secondary | ICD-10-CM

## 2016-11-11 MED ORDER — KETOROLAC TROMETHAMINE 60 MG/2ML IM SOLN
INTRAMUSCULAR | Status: AC
Start: 2016-11-11 — End: 2016-11-11
  Filled 2016-11-11: qty 2

## 2016-11-11 MED ORDER — PENICILLIN V POTASSIUM 500 MG PO TABS: 500.0000 mg | ORAL_TABLET | Freq: Four times a day (QID) | ORAL | 0 refills | Status: DC

## 2016-11-11 MED ORDER — OXYCODONE-ACETAMINOPHEN 5-325 MG PO TABS: 2.0000 | ORAL_TABLET | ORAL | 0 refills | Status: DC | PRN

## 2016-11-11 MED ORDER — KETOROLAC TROMETHAMINE 60 MG/2ML IM SOLN
60.0000 mg | Freq: Once | INTRAMUSCULAR | Status: AC
  Administered 2016-11-11: 60 mg via INTRAMUSCULAR

## 2016-11-11 NOTE — ED Triage Notes (Signed)
Pt  Reports   Toothache  l   Side  Of face   With  Onset     yest     Pt  reorts  Has a  Hole  In  One  Of  Her  Teeth  l   Side

## 2016-11-11 NOTE — Discharge Instructions (Signed)
Take the penicillin 4 times a day for 10 days. Continue Tylenol or ibuprofen as needed for pain. Use the oxycodone every 4-6 hours as needed for severe pain. Do not drive while taking this medicine. I provided a list of low-cost dental resources. Follow-up as needed.

## 2016-11-11 NOTE — ED Provider Notes (Signed)
[place]    CSN: [id number] Arrival date & time: 11/11/16  1109     History   Chief Complaint Chief Complaint  Patient presents with  . Dental Pain    HPI Katherine Wu is a 31 y.o. female.   HPI She is a 31 year old woman here for evaluation of left upper dental pain. This is been a recurring problem. She does not have any insurance and has been unable to afford to see a dentist. Last episode was September 2017. Pain is in the left upper molar. It got bad yesterday. She has been taking Goody powders, Tylenol, Excedrin without improvement. She reports unable to chew due to the pain. No known fevers.  Past Medical History:  Diagnosis Date  . Asthma     There are no active problems to display for this patient.   Past Surgical History:  Procedure Laterality Date  . NO PAST SURGERIES      OB History    Gravida Para Term Preterm AB Living   0 0 0 0 0 0   SAB TAB Ectopic Multiple Live Births   0 0 0 0 0       Home Medications    Prior to Admission medications   Medication Sig Start Date End Date Taking? Authorizing Provider  oxyCODONE-acetaminophen (PERCOCET/ROXICET) 5-325 MG tablet Take 2 tablets by mouth every 4 (four) hours as needed for severe pain. 11/11/16   Melony Overly, MD  penicillin v potassium (VEETID) 500 MG tablet Take 1 tablet (500 mg total) by mouth 4 (four) times daily. 11/11/16 11/21/16  Melony Overly, MD  promethazine (PHENERGAN) 25 MG tablet Take 0.5-1 tablets (12.5-25 mg total) by mouth every 6 (six) hours as needed for nausea. 06/11/16   Elvera Maria, CNM    Family History History reviewed. No pertinent family history.  Social History Social History  Substance Use Topics  . Smoking status: Former Smoker    Quit date: 04/25/2016  . Smokeless tobacco: Never Used  . Alcohol use No     Allergies   Patient has no known allergies.   Review of Systems Review of Systems As in history of present illness  Physical  Exam Triage Vital Signs ED Triage Vitals  Enc Vitals Group     BP 11/11/16 1157 138/90     Pulse Rate 11/11/16 1157 78     Resp 11/11/16 1157 18     Temp 11/11/16 1157 98.6 F (37 C)     Temp Source 11/11/16 1157 Oral     SpO2 11/11/16 1157 100 %     Weight --      Height --      Head Circumference --      Peak Flow --      Pain Score 11/11/16 1159 10     Pain Loc --      Pain Edu? --      Excl. in [place]? --    No data found.   Updated Vital Signs BP 138/90 (BP Location: Right Arm)   Pulse 78   Temp 98.6 F (37 C) (Oral)   Resp 18   LMP 10/13/2016   SpO2 100%   Visual Acuity Right Eye Distance:   Left Eye Distance:   Bilateral Distance:    Right Eye Near:   Left Eye Near:    Bilateral Near:     Physical Exam  Constitutional: She is oriented to person, place, and time. She appears well-developed  and well-nourished.  Looks to be in pain  HENT:  Mouth/Throat:    Cardiovascular: Normal rate.   Pulmonary/Chest: Effort normal.  Neurological: She is alert and oriented to person, place, and time.     UC Treatments / Results  Labs (all labs ordered are listed, but only abnormal results are displayed) Labs Reviewed - No data to display  EKG  EKG Interpretation None       Radiology No results found.  Procedures Procedures (including critical care time)  Medications Ordered in UC Medications  ketorolac (TORADOL) injection 60 mg (60 mg Intramuscular Given 11/11/16 1228)     Initial Impression / Assessment and Plan / UC Course  I have reviewed the triage vital signs and the nursing notes.  Pertinent labs & imaging results that were available during my care of the patient were reviewed by me and considered in my medical decision making (see chart for details).     Toradol given here for pain. Treat with penicillin and oxycodone. Dental resource handout given.  Final Clinical Impressions(s) / UC Diagnoses   Final diagnoses:  Pain, dental     New Prescriptions Discharge Medication List as of 11/11/2016 12:18 PM    START taking these medications   Details  oxyCODONE-acetaminophen (PERCOCET/ROXICET) 5-325 MG tablet Take 2 tablets by mouth every 4 (four) hours as needed for severe pain., Starting Thu 11/11/2016, Print    penicillin v potassium (VEETID) 500 MG tablet Take 1 tablet (500 mg total) by mouth 4 (four) times daily., Starting Thu 11/11/2016, Until Sun 11/21/2016, Normal         Melony Overly, MD 11/11/16 1311

## 2016-11-18 ENCOUNTER — Encounter (HOSPITAL_COMMUNITY): Payer: Self-pay | Admitting: Emergency Medicine

## 2016-11-18 ENCOUNTER — Ambulatory Visit (HOSPITAL_COMMUNITY)
Admission: EM | Admit: 2016-11-18 | Discharge: 2016-11-18 | Disposition: A | Discharge status: Home / Self Care | Payer: Self-pay | Attending: Emergency Medicine | Admitting: Emergency Medicine

## 2016-11-18 DIAGNOSIS — K029 Dental caries, unspecified: Secondary | ICD-10-CM

## 2016-11-18 DIAGNOSIS — K0889 Other specified disorders of teeth and supporting structures: Secondary | ICD-10-CM

## 2016-11-18 MED ORDER — CLINDAMYCIN HCL 300 MG PO CAPS: 300.0000 mg | ORAL_CAPSULE | Freq: Four times a day (QID) | ORAL | 0 refills | Status: DC

## 2016-11-18 MED ORDER — BUPIVACAINE-EPINEPHRINE (PF) 0.5% -1:200000 IJ SOLN
INTRAMUSCULAR | Status: AC
  Filled 2016-11-18: qty 1.8

## 2016-11-18 MED ORDER — CHLORHEXIDINE GLUCONATE 0.12 % MT SOLN: OROMUCOSAL | 0 refills | Status: DC

## 2016-11-18 MED ORDER — DICLOFENAC SODIUM 75 MG PO TBEC: 75.0000 mg | DELAYED_RELEASE_TABLET | Freq: Two times a day (BID) | ORAL | 0 refills | Status: DC

## 2016-11-18 MED ORDER — OXYCODONE-ACETAMINOPHEN 5-325 MG PO TABS: 1.0000 | ORAL_TABLET | ORAL | 0 refills | Status: DC | PRN

## 2016-11-18 NOTE — ED Triage Notes (Signed)
Here for persistent left sided dental pain .... Seen here on 11/11/2016 for similar sx  Was given PCN and Percocet w/no relief.  Has appt w/dentist on 11/23/2016  A&O x4... NAD

## 2016-11-18 NOTE — Discharge Instructions (Signed)
1 gram of Tylenol 4 times a day. Or take the Percocet instead of Tylenol. This with the diclofenac that you will be taking twice a day is an effective combination for pain. Do not take Percocet and Tylenol as they both contain Tylenol and too much Tylenol can hurt your liver.   peridex mouthwash or you may use Listerine. Stop the penicillin and start the clindamycin.  Look up the [place] of [facility] for free dental clinics. undoomedical.com.asp  Get there early and be prepared to wait. [facility] and [employer] have [employer] advertising schools that provide low cost routine dental care.   Other resources: [facility] [place], [place] [phone]  Patients with Medicaid: [place] [address] Phone:  [phone]                                                  Phone:  [phone]  Dr. Ardyth Harps [address]. [phone]  If unable to pay or uninsured, contact:  [place] or [facility]. to become qualified for the adult dental clinic.  No matter what dental problem you have, it will not get better unless you get good dental care.  If the tooth is not taken care of, your symptoms will come back in time and you will be visiting [place] again in the [facility] [place] with a bad toothache.  So, see your dentist as soon as possible.  If you don't have a dentist, we can give you a list of dentists.  Sometimes the most cost effective treatment is removal of the tooth.  This can be done very inexpensively through one of the low cost Dance movement psychotherapist such as the facility on [facility] in [place] [phone]).  The downside to this is that you will have one less tooth and this can effect your ability to chew.  Some other things that can be done for a dental infection  include the following:  Rinse your mouth out with hot salt water (1/2 tsp of table salt and a pinch of baking soda in 8 oz of hot water).  You can do this every 2 or 3 hours. Avoid cold foods, beverages, and cold air.  This will make your symptoms worse. Sleep with your head elevated.  Sleeping flat will cause your gums and oral tissues to swell and make them hurt more.  You can sleep on several pillows.  Even better is to sleep in a recliner with your head higher than your heart.  External application of heat by a heating pad, hot water bottle, or hot wet towel can help with pain and speed healing.  You can do this every 2 to 3 hours. Do not fall asleep on a heating pad since this can cause a burn.

## 2016-11-18 NOTE — ED Provider Notes (Signed)
HPI  SUBJECTIVE:  Katherine Wu is a 31 y.o. female who presents with dull, achy, throbbing constant left upper dental pain for the past several months. She has a dentist appointment in 4 days, but states that she cannot tolerate the pain. She states that the pain radiates all on her face into her ear. She does not recall any trauma to her tooth. No facial swelling, sore throat, voice changes, fevers, neck stiffness, trismus. She was seen at this facility on 2/1 for the same, given penicillin which she states that she is taking and Percocet #15. She has also been taking ibuprofen thousand milligrams several times a day and doing warm compresses. Symptoms have no alleviating factors. Symptoms are worse with eating and drinking and exposure to cold temperatures. She has taken an antipyretic in the past 6-8 hours. She has a past medical history of asthma. No history of diabetes, hypertension. LMP: Today. Denies possibility of being pregnant. PMD: None. Dentist: Dr. Leeroy Bock  Past Medical History:  Diagnosis Date  . Asthma     Past Surgical History:  Procedure Laterality Date  . NO PAST SURGERIES      History reviewed. No pertinent family history.  Social History  Substance Use Topics  . Smoking status: Former Smoker    Quit date: 04/25/2016  . Smokeless tobacco: Never Used  . Alcohol use No    No current facility-administered medications for this encounter.   Current Outpatient Prescriptions:  .  chlorhexidine (PERIDEX) 0.12 % solution, 15 mL swish and spit bid, Disp: 480 mL, Rfl: 0 .  clindamycin (CLEOCIN) 300 MG capsule, Take 1 capsule (300 mg total) by mouth 4 (four) times daily. X 10 days, Disp: 40 capsule, Rfl: 0 .  diclofenac (VOLTAREN) 75 MG EC tablet, Take 1 tablet (75 mg total) by mouth 2 (two) times daily. Take with food, Disp: 30 tablet, Rfl: 0 .  oxyCODONE-acetaminophen (PERCOCET/ROXICET) 5-325 MG tablet, Take 1-2 tablets by mouth every 4 (four) hours as needed for severe  pain., Disp: 15 tablet, Rfl: 0 .  promethazine (PHENERGAN) 25 MG tablet, Take 0.5-1 tablets (12.5-25 mg total) by mouth every 6 (six) hours as needed for nausea., Disp: 30 tablet, Rfl: 0  No Known Allergies   ROS  As noted in HPI.   Physical Exam  BP 130/78 (BP Location: Right Arm)   Pulse 65   Temp 97.8 F (36.6 C) (Oral)   Resp 14   LMP 11/18/2016   SpO2 100%   Constitutional: Well developed, well nourished, Appears to be a moderate painful distress Eyes:  EOMI, conjunctiva normal bilaterally HENT: Normocephalic, atraumatic,mucus membranes moist. Positive broken tooth #14, first left upper molar. Positive tooth tenderness to palpation. No expressible purulent drainage from the gums. Positive gingival tenderness. No facial swelling.  Positive facial tenderness upper left face Respiratory: Normal inspiratory effort Cardiovascular: Normal rate GI: nondistended skin: No rash, skin intact Musculoskeletal: no deformities Neurologic: Alert & oriented x 3, no focal neuro deficits Psychiatric: Speech and behavior appropriate   ED Course   Medications - No data to display  No orders of the defined types were placed in this encounter.   No results found for this or any previous visit (from the past 24 hour(s)). No results found.  ED Clinical Impression  Pain, dental  Dental caries   ED Assessment/Plan  [name] narcotic database reviewed. 2 opiate Prescriptions in the past 6 months from this facility, most recently Percocet 5/325 #15 dispensed on 11/11/2016. No  other opiate prescriptions.  Previous records reviewed as noted in history of present illness.  Procedure note: Using Marcaine with epinephrine, performed a dental block using local infiltration with complete relief symptoms. Used approximately 0.5 cc. Patient tolerated procedure well.  Patient has follow-up already, however since the penicillin is not working, we'll switch her to clindamycin. Discontinue  ibuprofen, start diclofenac. Tylenol 1 g 4 times a day or patient may take Percocet. Peridex or Listerine.  Doctor's note for today.  Discussed MDM, plan and followup with patient. Discussed sn/sx that should prompt return to the ED. Patient agrees with plan.   Meds ordered this encounter  Medications  . oxyCODONE-acetaminophen (PERCOCET/ROXICET) 5-325 MG tablet    Sig: Take 1-2 tablets by mouth every 4 (four) hours as needed for severe pain.    Dispense:  15 tablet    Refill:  0  . chlorhexidine (PERIDEX) 0.12 % solution    Sig: 15 mL swish and spit bid    Dispense:  480 mL    Refill:  0  . clindamycin (CLEOCIN) 300 MG capsule    Sig: Take 1 capsule (300 mg total) by mouth 4 (four) times daily. X 10 days    Dispense:  40 capsule    Refill:  0  . diclofenac (VOLTAREN) 75 MG EC tablet    Sig: Take 1 tablet (75 mg total) by mouth 2 (two) times daily. Take with food    Dispense:  30 tablet    Refill:  0    *This clinic note was created using [employer]. Therefore, there may be occasional mistakes despite careful proofreading.  ?   Melynda Ripple, MD 11/19/16 [phone]

## 2017-08-02 ENCOUNTER — Encounter (HOSPITAL_BASED_OUTPATIENT_CLINIC_OR_DEPARTMENT_OTHER): Payer: Self-pay | Admitting: Emergency Medicine

## 2017-08-02 ENCOUNTER — Emergency Department (HOSPITAL_BASED_OUTPATIENT_CLINIC_OR_DEPARTMENT_OTHER)
Admission: EM | Admit: 2017-08-02 | Discharge: 2017-08-02 | Disposition: A | Discharge status: Home / Self Care | Payer: BLUE CROSS/BLUE SHIELD | Attending: Emergency Medicine | Admitting: Emergency Medicine

## 2017-08-02 ENCOUNTER — Emergency Department (HOSPITAL_BASED_OUTPATIENT_CLINIC_OR_DEPARTMENT_OTHER): Payer: BLUE CROSS/BLUE SHIELD

## 2017-08-02 DIAGNOSIS — Y998 Other external cause status: Secondary | ICD-10-CM | POA: Diagnosis not present

## 2017-08-02 DIAGNOSIS — Y9389 Activity, other specified: Secondary | ICD-10-CM | POA: Diagnosis not present

## 2017-08-02 DIAGNOSIS — X503XXA Overexertion from repetitive movements, initial encounter: Secondary | ICD-10-CM | POA: Diagnosis not present

## 2017-08-02 DIAGNOSIS — J45909 Unspecified asthma, uncomplicated: Secondary | ICD-10-CM | POA: Insufficient documentation

## 2017-08-02 DIAGNOSIS — S4991XA Unspecified injury of right shoulder and upper arm, initial encounter: Secondary | ICD-10-CM | POA: Diagnosis present

## 2017-08-02 DIAGNOSIS — Z87891 Personal history of nicotine dependence: Secondary | ICD-10-CM | POA: Diagnosis not present

## 2017-08-02 DIAGNOSIS — Y9289 Other specified places as the place of occurrence of the external cause: Secondary | ICD-10-CM | POA: Insufficient documentation

## 2017-08-02 DIAGNOSIS — S46911A Strain of unspecified muscle, fascia and tendon at shoulder and upper arm level, right arm, initial encounter: Secondary | ICD-10-CM | POA: Diagnosis not present

## 2017-08-02 MED ORDER — METHOCARBAMOL 750 MG PO TABS: 750.0000 mg | ORAL_TABLET | Freq: Four times a day (QID) | ORAL | 0 refills | Status: DC

## 2017-08-02 MED FILL — METHOCARBAMOL 750 MG TABLET: 750 | 3 days supply | Qty: 25 | Fill #0

## 2017-08-02 NOTE — ED Provider Notes (Signed)
[facility] EMERGENCY DEPARTMENT Provider Note   CSN: [id number] Arrival date & time: 08/02/17  1606     History   Chief Complaint Chief Complaint  Patient presents with  . Shoulder Pain    HPI Katherine Wu is a 31 y.o. female.  The history is provided by the patient.  Shoulder Pain   This is a new problem. The current episode started more than 1 week ago. The problem occurs constantly. The problem has not changed since onset.The pain is present in the right shoulder. The quality of the pain is described as dull and aching. The pain is moderate. Associated symptoms include limited range of motion and stiffness. Pertinent negatives include no numbness. The symptoms are aggravated by activity. She has tried OTC pain medications for the symptoms. The treatment provided no relief. There has been no history of extremity trauma.   31 year old female who presents with constant right shoulder pain worse with movement over the past 2 weeks. She does repetitive lifting at work. Denies any numbness or weakness of the arm. Denies injury or trauma.  tried over-the-counter Tylenol without improvement. Past Medical History:  Diagnosis Date  . Asthma     There are no active problems to display for this patient.   Past Surgical History:  Procedure Laterality Date  . NO PAST SURGERIES      OB History    Gravida Para Term Preterm AB Living   0 0 0 0 0 0   SAB TAB Ectopic Multiple Live Births   0 0 0 0 0       Home Medications    Prior to Admission medications   Medication Sig Start Date End Date Taking? Authorizing Provider  chlorhexidine (PERIDEX) 0.12 % solution 15 mL swish and spit bid 11/18/16   Melynda Ripple, MD  clindamycin (CLEOCIN) 300 MG capsule Take 1 capsule (300 mg total) by mouth 4 (four) times daily. X 10 days 11/18/16   Melynda Ripple, MD  diclofenac (VOLTAREN) 75 MG EC tablet Take 1 tablet (75 mg total) by mouth 2 (two) times daily. Take with food  11/18/16   Melynda Ripple, MD  methocarbamol (ROBAXIN-750) 750 MG tablet Take 1 tablet (750 mg total) by mouth 4 (four) times daily. 08/02/17   Forde Dandy, MD  oxyCODONE-acetaminophen (PERCOCET/ROXICET) 5-325 MG tablet Take 1-2 tablets by mouth every 4 (four) hours as needed for severe pain. 11/18/16   Melynda Ripple, MD  promethazine (PHENERGAN) 25 MG tablet Take 0.5-1 tablets (12.5-25 mg total) by mouth every 6 (six) hours as needed for nausea. 06/11/16   Leftwich-Kirby, Kathie Dike, CNM    Family History No family history on file.  Social History Social History  Substance Use Topics  . Smoking status: Former Smoker    Quit date: 04/25/2016  . Smokeless tobacco: Never Used  . Alcohol use Yes     Allergies   Patient has no known allergies.   Review of Systems Review of Systems  Constitutional: Negative for fever.  Respiratory: Negative for shortness of breath.   Cardiovascular: Negative for chest pain.  Musculoskeletal: Positive for stiffness.       Right shoulder pain   Skin: Negative for wound.  Allergic/Immunologic: Negative for immunocompromised state.  Neurological: Negative for numbness.  Hematological: Does not bruise/bleed easily.     Physical Exam Updated Vital Signs BP (!) 144/95 (BP Location: Left Arm)   Pulse 73   Temp 98 F (36.7 C) (Oral)   Resp 18  Ht 5\' 10"  (1.778 m)   Wt 72.6 kg (160 lb)   LMP 07/12/2017   SpO2 99%   BMI 22.96 kg/m   Physical Exam Physical Exam  Constitutional: Appears well-developed and well-nourished. No acute distress. HENT:  Head: Normocephalic.  Eyes: Conjunctivae are normal.  Neck: Supple Cardiovascular: Normal rate and intact distal pulses.   Pulmonary/Chest: Effort normal. No respiratory distress.  Abdominal: Exhibits no distension.  Musculoskeletal: no deformity, swelling, or overlying skin changes of the right shoulder. There is limited range of motion due to pain. Neurological: Alert. Fluent speech. Intact  innervation of the axillary, median, ulnar and radial nerves of the right upper extremity. Skin: Skin is warm and dry.  Psychiatric: Normal mood and affect. Behavior is normal.  Nursing note and vitals reviewed.   ED Treatments / Results  Labs (all labs ordered are listed, but only abnormal results are displayed) Labs Reviewed - No data to display  EKG  EKG Interpretation None       Radiology Dg Shoulder Right  Result Date: 08/02/2017 CLINICAL DATA:  Severe right shoulder pain with numbness and tingling for several weeks, increased recently. Continued lifting and loading motion at work. EXAM: RIGHT SHOULDER - 2+ VIEW COMPARISON:  None. FINDINGS: There is no evidence of fracture or dislocation. There is no evidence of arthropathy or other focal bone abnormality. Soft tissues are unremarkable. IMPRESSION: Normal examination. Electronically Signed   By: Claudie Revering M.D.   On: 08/02/2017 16:41    Procedures Procedures (including critical care time)  Medications Ordered in ED Medications - No data to display   Initial Impression / Assessment and Plan / ED Course  I have reviewed the triage vital signs and the nursing notes.  Pertinent labs & imaging results that were available during my care of the patient were reviewed by me and considered in my medical decision making (see chart for details).     Presents with right shoulder pain for 2 weeks. Likely overuse injury as she does repetitive lifting at work. X-rays visualized and shows no fracture. Discussed supportive care management including icing, resting, and over-the-counter anti-inflammatory medications. Sports medicine referral provided. Strict return and follow-up instructions reviewed. She expressed understanding of all discharge instructions and felt comfortable with the plan of care.   Final Clinical Impressions(s) / ED Diagnoses   Final diagnoses:  Strain of right shoulder, initial encounter    New  Prescriptions New Prescriptions   METHOCARBAMOL (ROBAXIN-750) 750 MG TABLET    Take 1 tablet (750 mg total) by mouth 4 (four) times daily.     Forde Dandy, MD 08/02/17 [phone]

## 2017-08-02 NOTE — ED Triage Notes (Signed)
R shoulder pain x 2 weeks, no known injury.

## 2017-08-02 NOTE — Discharge Instructions (Signed)
Your x-ray does not show broken bone This is likely overuse injury or shoulder strain  Avoid heavy lifting or repetitive activity. Ice at rest. Take ibuprofen 600 mg every 6 hours as needed for pain. You can take this with tylenol as well.  You can also follow-up with our sports medicine doctor, listed.

## 2017-08-18 ENCOUNTER — Ambulatory Visit: Payer: Self-pay | Admitting: Obstetrics & Gynecology

## 2017-08-18 ENCOUNTER — Telehealth: Payer: Self-pay | Admitting: General Practice

## 2017-08-18 NOTE — Telephone Encounter (Signed)
Patient no showed for appt. Called patient, no answer- unable to leave message. Will send letter.

## 2018-10-04 ENCOUNTER — Emergency Department (HOSPITAL_COMMUNITY)
Admission: EM | Admit: 2018-10-04 | Discharge: 2018-10-04 | Disposition: A | Discharge status: Home / Self Care | Payer: Self-pay | Attending: Emergency Medicine | Admitting: Emergency Medicine

## 2018-10-04 ENCOUNTER — Other Ambulatory Visit (HOSPITAL_COMMUNITY): Payer: BLUE CROSS/BLUE SHIELD

## 2018-10-04 ENCOUNTER — Other Ambulatory Visit: Payer: Self-pay

## 2018-10-04 ENCOUNTER — Emergency Department (HOSPITAL_COMMUNITY): Payer: Self-pay

## 2018-10-04 ENCOUNTER — Encounter (HOSPITAL_COMMUNITY): Payer: Self-pay | Admitting: Emergency Medicine

## 2018-10-04 DIAGNOSIS — D259 Leiomyoma of uterus, unspecified: Secondary | ICD-10-CM | POA: Insufficient documentation

## 2018-10-04 DIAGNOSIS — R109 Unspecified abdominal pain: Secondary | ICD-10-CM

## 2018-10-04 DIAGNOSIS — R102 Pelvic and perineal pain: Secondary | ICD-10-CM

## 2018-10-04 DIAGNOSIS — J45909 Unspecified asthma, uncomplicated: Secondary | ICD-10-CM | POA: Insufficient documentation

## 2018-10-04 DIAGNOSIS — N83201 Unspecified ovarian cyst, right side: Secondary | ICD-10-CM

## 2018-10-04 LAB — CBC WITH DIFFERENTIAL/PLATELET
Abs Immature Granulocytes: 0.01 10*3/uL (ref 0.00–0.07)
Basophils Absolute: 0.1 10*3/uL (ref 0.0–0.1)
Basophils Relative: 1 %
EOS PCT: 2 %
Eosinophils Absolute: 0.2 10*3/uL (ref 0.0–0.5)
HEMATOCRIT: 41.7 % (ref 36.0–46.0)
Hemoglobin: 14.2 g/dL (ref 12.0–15.0)
Immature Granulocytes: 0 %
LYMPHS ABS: 2.5 10*3/uL (ref 0.7–4.0)
Lymphocytes Relative: 33 %
MCH: 31.9 pg (ref 26.0–34.0)
MCHC: 34.1 g/dL (ref 30.0–36.0)
MCV: 93.7 fL (ref 80.0–100.0)
Monocytes Absolute: 0.7 10*3/uL (ref 0.1–1.0)
Monocytes Relative: 9 %
Neutro Abs: 4 10*3/uL (ref 1.7–7.7)
Neutrophils Relative %: 55 %
Platelets: 266 10*3/uL (ref 150–400)
RBC: 4.45 MIL/uL (ref 3.87–5.11)
RDW: 12.1 % (ref 11.5–15.5)
WBC: 7.4 10*3/uL (ref 4.0–10.5)
nRBC: 0 % (ref 0.0–0.2)

## 2018-10-04 LAB — COMPREHENSIVE METABOLIC PANEL
ALT: 20 U/L (ref 0–44)
AST: 23 U/L (ref 15–41)
Albumin: 4.3 g/dL (ref 3.5–5.0)
Alkaline Phosphatase: 41 U/L (ref 38–126)
Anion gap: 6 (ref 5–15)
BILIRUBIN TOTAL: 0.8 mg/dL (ref 0.3–1.2)
BUN: 6 mg/dL (ref 6–20)
CO2: 25 mmol/L (ref 22–32)
Calcium: 8.7 mg/dL — ABNORMAL LOW (ref 8.9–10.3)
Chloride: 107 mmol/L (ref 98–111)
Creatinine, Ser: 0.78 mg/dL (ref 0.44–1.00)
GFR calc Af Amer: >60 mL/min (ref >60)
GFR calc non Af Amer: >60 mL/min (ref >60)
Glucose, Bld: 90 mg/dL (ref 70–99)
Potassium: 3.6 mmol/L (ref 3.5–5.1)
Sodium: 138 mmol/L (ref 135–145)
TOTAL PROTEIN: 6.4 g/dL — AB (ref 6.5–8.1)

## 2018-10-04 LAB — WET PREP, GENITAL
Clue Cells Wet Prep HPF POC: NONE SEEN
Sperm: NONE SEEN
Trich, Wet Prep: NONE SEEN
WBC WET PREP: NONE SEEN
Yeast Wet Prep HPF POC: NONE SEEN

## 2018-10-04 LAB — URINALYSIS, ROUTINE W REFLEX MICROSCOPIC
Bilirubin Urine: NEGATIVE
Glucose, UA: NEGATIVE mg/dL
Hgb urine dipstick: NEGATIVE
Ketones, ur: 5 mg/dL — AB
LEUKOCYTES UA: NEGATIVE
Nitrite: NEGATIVE
Protein, ur: NEGATIVE mg/dL
Specific Gravity, Urine: 1.005 (ref 1.005–1.030)
pH: 7 (ref 5.0–8.0)

## 2018-10-04 LAB — I-STAT BETA HCG BLOOD, ED (MC, WL, AP ONLY): I-stat hCG, quantitative: <5 m[IU]/mL (ref <5)

## 2018-10-04 LAB — LIPASE, BLOOD: Lipase: 26 U/L (ref 11–51)

## 2018-10-04 MED ORDER — HYDROMORPHONE HCL 1 MG/ML IJ SOLN
0.5000 mg | INTRAMUSCULAR | Status: DC | PRN
  Administered 2018-10-04 (×2): 0.5 mg via INTRAVENOUS
  Filled 2018-10-04 (×2): qty 1

## 2018-10-04 MED ORDER — ONDANSETRON 8 MG PO TBDP: 8.0000 mg | ORAL_TABLET | Freq: Three times a day (TID) | ORAL | 0 refills | Status: DC | PRN

## 2018-10-04 MED ORDER — SODIUM CHLORIDE 0.9 % IV SOLN: INTRAVENOUS | Status: DC

## 2018-10-04 MED ORDER — SODIUM CHLORIDE 0.9 % IV BOLUS
1000.0000 mL | Freq: Once | INTRAVENOUS | Status: AC
  Administered 2018-10-04: 1000 mL via INTRAVENOUS

## 2018-10-04 MED ORDER — ONDANSETRON HCL 4 MG/2ML IJ SOLN
4.0000 mg | Freq: Once | INTRAMUSCULAR | Status: AC
  Administered 2018-10-04: 4 mg via INTRAVENOUS
  Filled 2018-10-04: qty 2

## 2018-10-04 MED ORDER — IBUPROFEN 600 MG PO TABS: 600.0000 mg | ORAL_TABLET | Freq: Three times a day (TID) | ORAL | 0 refills | Status: DC | PRN

## 2018-10-04 NOTE — ED Notes (Signed)
Pelvic cart is at bedside.

## 2018-10-04 NOTE — ED Triage Notes (Signed)
Repots lower abdominal pain x 2 days.  Reports nausea.  Denies vomiting, diarrhea, vaginal bleeding, discharge, dysuria.

## 2018-10-04 NOTE — ED Notes (Signed)
ED Provider at bedside. 

## 2018-10-04 NOTE — ED Notes (Signed)
Ultrasound at bedside

## 2018-10-04 NOTE — ED Provider Notes (Signed)
[facility] DEPT Provider Note   CSN: [id number] Arrival date & time: 10/04/18  1138     History   Chief Complaint Chief Complaint  Patient presents with  . Abdominal Pain    HPI Katherine Wu is a 32 y.o. female.  HPI Pt started having pain in her lower abdomen the last couple of days.  She thought it might be gas so she tried OTC meds but they have not helped.  The pain is on both sides.  She has had nausea but no vomiting.  No diarrhea or constipation. No dysuria.  No vaginal bleeding or discharge.   LMP 3 weeks ago.  Normal.    Past Medical History:  Diagnosis Date  . Asthma     There are no active problems to display for this patient.   Past Surgical History:  Procedure Laterality Date  . NO PAST SURGERIES       OB History    Gravida  0   Para  0   Term  0   Preterm  0   AB  0   Living  0     SAB  0   TAB  0   Ectopic  0   Multiple  0   Live Births  0            Home Medications    Prior to Admission medications   Medication Sig Start Date End Date Taking? Authorizing Provider  albuterol (PROVENTIL HFA;VENTOLIN HFA) 108 (90 Base) MCG/ACT inhaler Inhale 2 puffs into the lungs every 6 (six) hours as needed for wheezing or shortness of breath.   Yes [provider]  ibuprofen (ADVIL,MOTRIN) 200 MG tablet Take 400 mg by mouth every 6 (six) hours as needed for moderate pain.   Yes [provider]  naproxen (NAPROSYN) 500 MG tablet Take 500 mg by mouth daily as needed for moderate pain.   Yes [provider]  chlorhexidine (PERIDEX) 0.12 % solution 15 mL swish and spit bid Patient not taking: Reported on 10/04/2018 11/18/16   Melynda Ripple, MD  clindamycin (CLEOCIN) 300 MG capsule Take 1 capsule (300 mg total) by mouth 4 (four) times daily. X 10 days Patient not taking: Reported on 10/04/2018 11/18/16   Melynda Ripple, MD  diclofenac (VOLTAREN) 75 MG EC tablet Take 1 tablet (75 mg  total) by mouth 2 (two) times daily. Take with food Patient not taking: Reported on 10/04/2018 11/18/16   Melynda Ripple, MD  methocarbamol (ROBAXIN-750) 750 MG tablet Take 1 tablet (750 mg total) by mouth 4 (four) times daily. Patient not taking: Reported on 10/04/2018 08/02/17   Forde Dandy, MD  oxyCODONE-acetaminophen (PERCOCET/ROXICET) 5-325 MG tablet Take 1-2 tablets by mouth every 4 (four) hours as needed for severe pain. Patient not taking: Reported on 10/04/2018 11/18/16   Melynda Ripple, MD  promethazine (PHENERGAN) 25 MG tablet Take 0.5-1 tablets (12.5-25 mg total) by mouth every 6 (six) hours as needed for nausea. Patient not taking: Reported on 10/04/2018 06/11/16   Elvera Maria, CNM    Family History History reviewed. No pertinent family history.  Social History Social History   Tobacco Use  . Smoking status: Former Smoker    Last attempt to quit: 04/25/2016    Years since quitting: 2.4  . Smokeless tobacco: Never Used  Substance Use Topics  . Alcohol use: Yes  . Drug use: No     Allergies   Patient has no known  allergies.   Review of Systems Review of Systems  All other systems reviewed and are negative.    Physical Exam Updated Vital Signs BP 111/64   Pulse (!) 50   Temp 98 F (36.7 C) (Oral)   Resp 16   Ht 1.575 m (5\' 2" )   Wt 81.6 kg   LMP 09/13/2018 (Approximate)   SpO2 99%   BMI 32.92 kg/m   Physical Exam Vitals signs and nursing note reviewed.  Constitutional:      General: She is not in acute distress.    Appearance: She is well-developed.  HENT:     Head: Normocephalic and atraumatic.     Right Ear: External ear normal.     Left Ear: External ear normal.  Eyes:     General: No scleral icterus.       Right eye: No discharge.        Left eye: No discharge.     Conjunctiva/sclera: Conjunctivae normal.  Neck:     Musculoskeletal: Neck supple.     Trachea: No tracheal deviation.  Cardiovascular:     Rate and Rhythm:  Normal rate and regular rhythm.  Pulmonary:     Effort: Pulmonary effort is normal. No respiratory distress.     Breath sounds: Normal breath sounds. No stridor. No wheezing or rales.  Abdominal:     General: Bowel sounds are normal. There is no distension.     Palpations: Abdomen is soft.     Tenderness: There is abdominal tenderness in the right lower quadrant, suprapubic area and left lower quadrant. There is guarding. There is no rebound.  Genitourinary:    Exam position: Supine.     Labia:        Right: No rash, tenderness or lesion.        Left: No rash, tenderness or lesion.      Vagina: Normal.     Cervix: Cervical bleeding present. No cervical motion tenderness or discharge.     Uterus: Enlarged and tender.      Adnexa:        Right: No mass, tenderness or fullness.         Left: No mass, tenderness or fullness.       Comments: Small amount of blood at cervical os Musculoskeletal:        General: No tenderness.  Skin:    General: Skin is warm and dry.     Findings: No rash.  Neurological:     Mental Status: She is alert.     Cranial Nerves: No cranial nerve deficit (no facial droop, extraocular movements intact, no slurred speech).     Sensory: No sensory deficit.     Motor: No abnormal muscle tone or seizure activity.     Coordination: Coordination normal.      ED Treatments / Results  Labs (all labs ordered are listed, but only abnormal results are displayed) Labs Reviewed  COMPREHENSIVE METABOLIC PANEL - Abnormal; Notable for the following components:      Result Value   Calcium 8.7 (*)    Total Protein 6.4 (*)    All other components within normal limits  URINALYSIS, ROUTINE W REFLEX MICROSCOPIC - Abnormal; Notable for the following components:   Color, Urine STRAW (*)    Ketones, ur 5 (*)    All other components within normal limits  WET PREP, GENITAL  LIPASE, BLOOD  CBC WITH DIFFERENTIAL/PLATELET  RPR  HIV ANTIBODY (ROUTINE TESTING W REFLEX)  I-STAT  BETA HCG BLOOD, ED ([facility], [facility], [facility] ONLY)  GC/CHLAMYDIA PROBE AMP ([place]) NOT AT [facility]     Radiology No results found.  Procedures Procedures (including critical care time)  Medications Ordered in ED Medications  sodium chloride 0.9 % bolus 1,000 mL (0 mLs Intravenous Stopped 10/04/18 1434)    And  0.9 %  sodium chloride infusion (has no administration in time range)  HYDROmorphone (DILAUDID) injection 0.5 mg (0.5 mg Intravenous Given 10/04/18 1437)  ondansetron (ZOFRAN) injection 4 mg (4 mg Intravenous Given 10/04/18 1235)     Initial Impression / Assessment and Plan / ED Course  I have reviewed the triage vital signs and the nursing notes.  Pertinent labs & imaging results that were available during my care of the patient were reviewed by me and considered in my medical decision making (see chart for details).  Clinical Course as of Oct 04 1540  Wed Oct 04, 2018  1307 TTP on pelvic exam.  Uterus enlarged.  Prior records indicate uterine fibroids.  Will [place] to evaluate for any complications   [JK]    Clinical Course User Index [JK] Dorie Rank, MD    Pt presented with lower abdominal pain.  Uterus is enlarged on pelvic exam.  Pregnancy negative.  Records indicate a history of uterine fibroids.  Suspect sx may be related to her fibroids again.  [place] ordered to evaluate for other cause of her acute pain.  If negative will dc home with symptomatic treatment, ob gyn follow up.  Dr Venora Maples will follow up on the [place]  Final Clinical Impressions(s) / ED Diagnoses   Final diagnoses:  Uterine leiomyoma, unspecified location    ED Discharge Orders    None       Dorie Rank, MD 10/04/18 1541

## 2018-10-05 LAB — HIV ANTIBODY (ROUTINE TESTING W REFLEX): HIV Screen 4th Generation wRfx: NONREACTIVE

## 2018-10-05 LAB — GC/CHLAMYDIA PROBE AMP (~~LOC~~) NOT AT ARMC
Chlamydia: NEGATIVE
Neisseria Gonorrhea: NEGATIVE

## 2018-10-05 LAB — RPR: RPR Ser Ql: NONREACTIVE

## 2018-10-06 ENCOUNTER — Encounter: Payer: Self-pay | Admitting: Obstetrics and Gynecology

## 2018-10-06 ENCOUNTER — Ambulatory Visit (INDEPENDENT_AMBULATORY_CARE_PROVIDER_SITE_OTHER): Payer: Self-pay | Admitting: Obstetrics and Gynecology

## 2018-10-06 VITALS — BP 148/102 | HR 67 | Wt 186.0 lb

## 2018-10-06 DIAGNOSIS — N83201 Unspecified ovarian cyst, right side: Secondary | ICD-10-CM | POA: Insufficient documentation

## 2018-10-06 DIAGNOSIS — R1030 Lower abdominal pain, unspecified: Secondary | ICD-10-CM

## 2018-10-06 NOTE — Progress Notes (Signed)
Obstetrics and Gynecology Visit Return Patient Evaluation  Appointment Date: 10/06/2018  Primary Care Provider: Patient, No Pcp Per  OBGYN Clinic: [facility] for [facility]  Chief Complaint: ED follow up  History of Present Illness:  Katherine Wu is a 32 y.o. G0 (LMP: early December) with the above CC. PMHx negative.  Per ED notes, patient went to ED on 12/25 with abdominal pain that was continuing to occur over the past few days. Exam unremarkable except for some bleeding in the vaginal vault and fibroid uterus likely 3cm RO HC noted. Negative U/A, beta, wet prep, cmp, lipase, cbc, rpr, hiv, gc/ct. U/s did note 10cm fibroid uterus but appeared smaller than u/s done in 2017 where it was 13cm.   Patient states that pain started about a week prior going to the ED and felt to be worsening. She state no prior episodes in the past and that it feels different then menstrual cramps with her period ending it sounds like prior to the pain starting.   Interval History: Since that time, she states that she feels better since leaving the ED with using OTC NSAIDs but that the pain is still there.   She denies any VB, dysuria, constipation, diarrhea, blood in BMs or urine, nausea, vomiting, fevers, chills, chest pain, SOB  Review of Systems:  as noted in the History of Present Illness.   Patient Active Problem List   Diagnosis Date Noted  . Hemorrhagic cyst of right ovary 10/06/2018   Medications:  Katherine D. Montalto had no medications administered during this visit. Current Outpatient Medications  Medication Sig Dispense Refill  . albuterol (PROVENTIL HFA;VENTOLIN HFA) 108 (90 Base) MCG/ACT inhaler Inhale 2 puffs into the lungs every 6 (six) hours as needed for wheezing or shortness of breath.    . diclofenac (VOLTAREN) 75 MG EC tablet Take 1 tablet (75 mg total) by mouth 2 (two) times daily. Take with food (Patient not taking: Reported on 10/04/2018) 30 tablet 0  . ibuprofen  (ADVIL,MOTRIN) 600 MG tablet Take 1 tablet (600 mg total) by mouth every 8 (eight) hours as needed. (Patient not taking: Reported on 10/06/2018) 15 tablet 0  . ondansetron (ZOFRAN ODT) 8 MG disintegrating tablet Take 1 tablet (8 mg total) by mouth every 8 (eight) hours as needed for nausea or vomiting. (Patient not taking: Reported on 10/06/2018) 10 tablet 0   No current facility-administered medications for this visit.     Allergies: has No Known Allergies.  Physical Exam:  BP (!) 148/102   Pulse 67   Wt 186 lb (84.4 kg)   LMP 09/13/2018 (Approximate)   BMI 34.02 kg/m  Body mass index is 34.02 kg/m. General appearance: Well nourished, well developed female in no acute distress.  CV: normal s1 and s2, no MRGs  Abdomen: diffusely non tender to palpation, non distended, and no masses, hernias Neuro/Psych:  Normal mood and affect.    Pelvic exam:  EGBUS, vaginal vault and cervix: within normal limits 10wk sized, mobile, slightly off to her right, pt states it was slightly ttp on bimanual   Labs:  As per HPI  Radiology: As per HPI  CLINICAL DATA:  Pelvic pain 4 days.  EXAM: TRANSABDOMINAL AND TRANSVAGINAL ULTRASOUND OF PELVIS  DOPPLER ULTRASOUND OF OVARIES  TECHNIQUE: Both transabdominal and transvaginal ultrasound examinations of the pelvis were performed. Transabdominal technique was performed for global imaging of the pelvis including uterus, ovaries, adnexal regions, and pelvic cul-de-sac.  It was necessary to proceed with endovaginal exam  following the transabdominal exam to visualize the uterus, endometrium and ovaries. Color and duplex Doppler ultrasound was utilized to evaluate blood flow to the ovaries.  COMPARISON:  None.  FINDINGS: Uterus  Measurements: 10.0 x 6.3 x 7.6 cm = volume: 249 mL. Several rounded fibroids with posterior shadowing.  Endometrium  Thickness: Upper limits of normal for premenopausal female at 16 mm. No focal  abnormality visualized.  Right ovary  Measurements: 5.7 x 4.2 x 4.5 cm = volume: 55 mL. Well-circumscribed unilocular cystic lesion measuring 3.7 x 2.9 x 4.2 cm. Unilocular cyst has lacy internal echoes typical of a hemorrhagic cyst. No internal blood flow.  Left ovary  Measurements: 4.5 x 1.9 x 2.3 cm = volume: 9.8 mL. Normal appearance/no adnexal mass.  Pulsed Doppler evaluation of both ovaries demonstrates normal low-resistance arterial and venous waveforms.  Other findings  No abnormal free fluid.  IMPRESSION: 1. No ovarian torsion 2. RIGHT ovarian cystic complex has imaging characteristics typical of a hemorrhagic cyst. For hemorrhagic cysts less than 5 cm no follow-up required. This recommendation follows the consensus statement: Management of Asymptomatic Ovarian and Other Adnexal Cysts Imaged at [place]: Society of Radiologists in [place]. Radiology 2010; [phone]. 3. Normal LEFT ovary. 4. Endometrium upper limits of normal for premenopausal female. 5. Fibroid uterus.   Electronically Signed   By: Suzy Bouchard M.D.   On: 10/04/2018 16:23  Assessment: pt stable  Plan:  1. Hemorrhagic cyst of right ovary, Fibroids I told her that I find it unlikely that the cyst was the cause of the pain and that there was no evidence of rupture (normal CBC, no FF on u/s) but that if it was related to the cyst that it should get better with time. I also told her that I don't believe that the pain was related to the fibroids b/c no evidence of infarction or calcification and that even if so NSAIDs and time are the best treatment. I told her that it her s/s could've been related to a GYN etiology but the good part is that this is the first time that this has happened. I told her that if happens again then I'd strongly consider doing something like a depo provera shot to suppress her cycles to see if that helps but for now I'd continue to  just do PRN OTCs and heat packs.  I also told her that her u/s findings shouldn't affect her fertility    2. GYN Information given regarding BCCCP program and importance of pap smears  RTC: PRN  Durene Romans MD Attending Center for [facility])

## 2018-10-06 NOTE — Progress Notes (Signed)
Tried naproxyn not working never took the tylenol, took Advil Pm to rest, Nothing makes it better, walking bending moving makes it work. Pt states its a sharp breathtaking pain and the pain feels pulsating.

## 2018-10-06 NOTE — Patient Instructions (Signed)
Please call the phone number for pap smears at [phone] to ensure that you don't have cervical cancer.

## 2020-08-14 ENCOUNTER — Ambulatory Visit (INDEPENDENT_AMBULATORY_CARE_PROVIDER_SITE_OTHER): Payer: Self-pay | Admitting: Obstetrics and Gynecology

## 2020-08-14 ENCOUNTER — Other Ambulatory Visit: Payer: Self-pay

## 2020-08-14 ENCOUNTER — Encounter: Payer: Self-pay | Admitting: Obstetrics and Gynecology

## 2020-08-14 VITALS — BP 132/85 | HR 91 | Wt 186.7 lb

## 2020-08-14 DIAGNOSIS — Z86018 Personal history of other benign neoplasm: Secondary | ICD-10-CM

## 2020-08-14 DIAGNOSIS — N83201 Unspecified ovarian cyst, right side: Secondary | ICD-10-CM

## 2020-08-14 NOTE — Progress Notes (Signed)
Obstetrics and Gynecology Visit Return Patient Evaluation  Appointment Date: 08/14/2020  Primary Care Provider: Patient, No Pcp Per  OBGYN Clinic: [facility]  Chief Complaint: follow up fibroids  History of Present Illness:  Katherine Wu is a 34 y.o. P0 (LMP 10/19).  She states she went to a women's clinic in [place] a few months ago for a check up and they state that they felt, on physical exam that her fibroid felt bigger and she needed follow up for this; she has not had any imaging since I saw her two years ago for ED follow up for abdominal pain which showed some fibroids and a likely hemorrhagic cyst. She states that prior to that visit she had been having some low belly discomfort  She has qmonth, regular periods that are about 5-7 days and are painful but not really bleeding.   Review of Systems: as noted in the History of Present Illness.  Patient Active Problem List   Diagnosis Date Noted  . Hemorrhagic cyst of right ovary 10/06/2018   Medications:  Katherine D. Gambino had no medications administered during this visit. Current Outpatient Medications  Medication Sig Dispense Refill  . albuterol (PROVENTIL HFA;VENTOLIN HFA) 108 (90 Base) MCG/ACT inhaler Inhale 2 puffs into the lungs every 6 (six) hours as needed for wheezing or shortness of breath.     No current facility-administered medications for this visit.    Allergies: has No Known Allergies.  Physical Exam:  BP 132/85   Pulse 91   Wt 186 lb 11.2 oz (84.7 kg)   LMP 07/29/2020 (Exact Date)   BMI 34.15 kg/m  Body mass index is 34.15 kg/m. General appearance: Well nourished, well developed female in no acute distress.  Abdomen: diffusely non tender to palpation, non distended, and no masses, hernias Neuro/Psych:  Normal mood and affect.    Pelvic exam:  EGBUS, vaginal vault and cervix: within normal limits Uterus: 10wk sized, feels RV or potentially a fibroid in the right lower  pelvis   Assessment: pt stable  Plan:  1. History of uterine fibroid My note from 2019 shows a similar exam. D/w her re: need for u/s. Pt to let us know the clinic name and/or try to get her pap and swab results from that prior [facility] visit; she did state she had some BV and they sent in some flagyl.  - US PELVIC COMPLETE WITH TRANSVAGINAL; Future   RTC: after early cycle u/s later this month  Durene Romans MD Attending Center for [employer])

## 2020-08-15 DIAGNOSIS — Z86018 Personal history of other benign neoplasm: Secondary | ICD-10-CM | POA: Insufficient documentation

## 2020-08-18 ENCOUNTER — Telehealth: Payer: Self-pay

## 2020-08-18 NOTE — Telephone Encounter (Signed)
Called pt to notify of new [place] appt on 08/28/20 at 1545. Explained pt should arrive at 1530 with full bladder. Instructed pt to bring insurance card to appt as we do not have this on her chart.

## 2020-08-28 ENCOUNTER — Ambulatory Visit
Admission: RE | Admit: 2020-08-28 | Discharge: 2020-08-28 | Disposition: A | Discharge status: Home / Self Care | Payer: Self-pay | Source: Ambulatory Visit | Attending: Obstetrics and Gynecology | Admitting: Obstetrics and Gynecology

## 2020-08-28 ENCOUNTER — Other Ambulatory Visit: Payer: Self-pay

## 2020-08-28 DIAGNOSIS — Z86018 Personal history of other benign neoplasm: Secondary | ICD-10-CM | POA: Insufficient documentation

## 2020-09-11 ENCOUNTER — Ambulatory Visit: Payer: Self-pay | Admitting: Obstetrics and Gynecology

## 2020-10-29 ENCOUNTER — Ambulatory Visit: Payer: Self-pay | Admitting: Obstetrics & Gynecology

## 2020-11-19 ENCOUNTER — Encounter: Payer: Self-pay | Admitting: Obstetrics and Gynecology

## 2020-11-19 ENCOUNTER — Ambulatory Visit (INDEPENDENT_AMBULATORY_CARE_PROVIDER_SITE_OTHER): Payer: Self-pay | Admitting: Obstetrics and Gynecology

## 2020-11-19 ENCOUNTER — Other Ambulatory Visit: Payer: Self-pay

## 2020-11-19 VITALS — BP 121/92 | HR 102 | Ht 71.0 in | Wt 199.0 lb

## 2020-11-19 DIAGNOSIS — N946 Dysmenorrhea, unspecified: Secondary | ICD-10-CM

## 2020-11-19 DIAGNOSIS — Z86018 Personal history of other benign neoplasm: Secondary | ICD-10-CM

## 2020-11-19 NOTE — Progress Notes (Signed)
Obstetrics and Gynecology Visit Return Patient Evaluation  Appointment Date: 11/19/2020  Primary Care Provider: Patient, No Pcp Per  OBGYN Clinic: [facility]  Chief Complaint: follow up u/s  History of Present Illness:  Katherine Wu is a 35 y.o. G0. Patient last seen by me in 08/2020 for f/u visit after provider in [place] noted pelvic mass; she was previously seen by me for f/u hemorrhagic ovarian cyst ED visit and 10cm fibroid was noted. At her visit with me, she noted qmonth, regular periods that were painful but not too heavy or painful. U/s ordered and it appears stable to prior ones. Periods unchanged.    Review of Systems:  as noted in the History of Present Illness.  Patient Active Problem List   Diagnosis Date Noted  . Dysmenorrhea 11/19/2020  . History of uterine fibroid 08/15/2020   Medications:  Katherine D. Totino had no medications administered during this visit. Current Outpatient Medications  Medication Sig Dispense Refill  . albuterol (PROVENTIL HFA;VENTOLIN HFA) 108 (90 Base) MCG/ACT inhaler Inhale 2 puffs into the lungs every 6 (six) hours as needed for wheezing or shortness of breath.     No current facility-administered medications for this visit.    Allergies: has No Known Allergies.  Physical Exam:  BP (!) 121/92   Pulse (!) 102   Ht 5\' 11"  (1.803 m)   Wt 199 lb (90.3 kg)   LMP 11/02/2020 (Within Days)   BMI 27.75 kg/m  Body mass index is 27.75 kg/m. General appearance: Well nourished, well developed female in no acute distress.  Neuro/Psych:  Normal mood and affect  Radiology Narrative & Impression  CLINICAL DATA:  Initial evaluation for lower abdominal/pelvic discomfort. History of fibroids.  EXAM: TRANSABDOMINAL AND TRANSVAGINAL ULTRASOUND OF PELVIS  TECHNIQUE: Both transabdominal and transvaginal ultrasound examinations of the pelvis were performed. Transabdominal technique was performed  for global imaging of the pelvis including uterus, ovaries, adnexal regions, and pelvic cul-de-sac. It was necessary to proceed with endovaginal exam following the transabdominal exam to visualize the uterus, endometrium, and ovaries.  COMPARISON:  Prior ultrasound from 10/04/2018.  FINDINGS: Uterus  Measurements: 10.1 x 4.3 x 12.0 cm = volume: 271.8 mL. Uterus is retroflexed. Multiple large uterine fibroids are seen as follows. 9.0 x 8.7 x 8.3 cm subserosal to exophytic fibroid extends from the lower uterine segment. 7.8 x 6.6 x 5.6 cm intramural fibroid at the left anterior fundus. 5.4 x 4.7 x 4.5 cm fibroid extends from the mid posterior uterine fundus.  Endometrium  Thickness: 9.8 mm.  No focal abnormality visualized.  Right ovary  Measurements: 3.6 x 2.4 x 1.3 cm = volume: 5.7 mL. Normal appearance/no adnexal mass.  Left ovary  Measurements: 4.5 x 2.6 x 2.1 cm = volume: 13.1 mL. Normal appearance/no adnexal mass.  Other findings  Trace free fluid within the pelvis, presumably physiologic.  IMPRESSION: 1. Enlarged fibroid uterus as detailed above, with largest fibroid measuring up to 9 cm at the lower uterine segment. 2. Normal sonographic appearance of the endometrium and ovaries. No adnexal mass.   Electronically Signed   By: Jeannine Boga M.D.   On: 08/29/2020 05:11    Assessment: pt stable  Plan: Ultrasound images reviewed, and it looks like her main s/s are likely due to a large 9-10cm posterior cul de sac fibroid.   Options d/w her. She does note some pelvic pressure and increased urinary frequency but it doesn't sound bad and same thing for her  periods where they aren't impacting her ADLs. I told her that exp management is an option but I would recommend OCPs or depo provera to help with her periods. She states that she is interested in childbearing. I told her that the location of her fibroids don't necessarily impact fertility but  the only real way to know would be to see what happens when she tries to conceive and gets pregnant.  I also told her that surgery has risk of bleeding, infection, hysterectomy, scar tissue and may negatively impact fertility. In terms of her s/s then, yes, removal would help but with the above caveats. Pt to consider options and let me know but I told her that if she's interested in removal, I would get an MRI b/c it looks like she has large but discrete fibroids so referral for [facility] for laparoscopic removal consideration would be recommended.   RTC: pt to let me know next week how she would like to proceed.   Durene Romans MD Attending Center for [employer] [facility])

## 2020-11-20 ENCOUNTER — Telehealth: Payer: Self-pay | Admitting: General Practice

## 2020-11-20 NOTE — Telephone Encounter (Signed)
Pt received pfizer vaccine she believes in nov 2021 for first dose . Pt will consult with PCP before getting 2nd dose due to having headaches after 1st dose

## 2021-06-05 ENCOUNTER — Other Ambulatory Visit: Payer: Self-pay

## 2021-06-05 ENCOUNTER — Ambulatory Visit
Admission: EM | Admit: 2021-06-05 | Discharge: 2021-06-05 | Disposition: A | Discharge status: Home / Self Care | Payer: BLUE CROSS/BLUE SHIELD | Attending: Internal Medicine | Admitting: Internal Medicine

## 2021-06-05 DIAGNOSIS — M546 Pain in thoracic spine: Secondary | ICD-10-CM | POA: Diagnosis not present

## 2021-06-05 DIAGNOSIS — S161XXA Strain of muscle, fascia and tendon at neck level, initial encounter: Secondary | ICD-10-CM

## 2021-06-05 MED ORDER — CYCLOBENZAPRINE HCL 10 MG PO TABS: 5.0000 mg | ORAL_TABLET | Freq: Two times a day (BID) | ORAL | 0 refills | Status: DC | PRN

## 2021-06-05 MED ORDER — PREDNISONE 20 MG PO TABS: 40.0000 mg | ORAL_TABLET | Freq: Every day | ORAL | 0 refills | Status: AC

## 2021-06-05 NOTE — ED Provider Notes (Signed)
[name]    CSN: [id number] Arrival date & time: 06/05/21  1023      History   Chief Complaint Chief Complaint  Patient presents with   Back Pain    HPI Katherine Wu is a 35 y.o. female.   Patient presents with intermittent headaches, left arm pain, left neck pain, upper back pain that has been present for approximately 1 to 2 days. pain is described as "sharp and shooting".  Denies any chest pain or shortness of breath.  Patient denies any known injury but states she does a lot of lifting at job at [employer].  Has taken over-the-counter Aleve and Motrin and has used ice application.  Denies any numbness or tingling.  Denies any cardiac history.   Back Pain  Past Medical History:  Diagnosis Date   Asthma    Transient hypertension     Patient Active Problem List   Diagnosis Date Noted   Dysmenorrhea 11/19/2020   History of uterine fibroid 08/15/2020    Past Surgical History:  Procedure Laterality Date   NO PAST SURGERIES      OB History     Gravida  0   Para  0   Term  0   Preterm  0   AB  0   Living  0      SAB  0   IAB  0   Ectopic  0   Multiple  0   Live Births  0            Home Medications    Prior to Admission medications   Medication Sig Start Date End Date Taking? Authorizing Provider  cyclobenzaprine (FLEXERIL) 10 MG tablet Take 0.5 tablets (5 mg total) by mouth 2 (two) times daily as needed for muscle spasms. 06/05/21  Yes Odis Luster, FNP  predniSONE (DELTASONE) 20 MG tablet Take 2 tablets (40 mg total) by mouth daily for 5 days. 06/05/21 06/10/21 Yes Odis Luster, FNP  albuterol (PROVENTIL HFA;VENTOLIN HFA) 108 (90 Base) MCG/ACT inhaler Inhale 2 puffs into the lungs every 6 (six) hours as needed for wheezing or shortness of breath.    [provider]    Family History History reviewed. No pertinent family history.  Social History Social History   Tobacco Use   Smoking status: Every Day     Types: Cigarettes    Last attempt to quit: 04/25/2016    Years since quitting: 5.1   Smokeless tobacco: Never  Substance Use Topics   Alcohol use: Yes   Drug use: No     Allergies   Patient has no known allergies.   Review of Systems Review of Systems  Musculoskeletal:  Positive for back pain.  Per HPI  Physical Exam Triage Vital Signs ED Triage Vitals  Enc Vitals Group     BP 06/05/21 1048 (!) 151/99     Pulse Rate 06/05/21 1048 76     Resp 06/05/21 1048 18     Temp 06/05/21 1048 98 F (36.7 C)     Temp Source 06/05/21 1048 Oral     SpO2 06/05/21 1048 98 %     Weight --      Height --      Head Circumference --      Peak Flow --      Pain Score 06/05/21 1049 10     Pain Loc --      Pain Edu? --      Excl.  in [place]? --    No data found.  Updated Vital Signs BP (!) 151/99 (BP Location: Right Arm)   Pulse 76   Temp 98 F (36.7 C) (Oral)   Resp 18   LMP 06/01/2021 (Exact Date)   SpO2 98%   Visual Acuity Right Eye Distance:   Left Eye Distance:   Bilateral Distance:    Right Eye Near:   Left Eye Near:    Bilateral Near:     Physical Exam Constitutional:      Appearance: Normal appearance.  HENT:     Head: Normocephalic and atraumatic.  Eyes:     Extraocular Movements: Extraocular movements intact.     Conjunctiva/sclera: Conjunctivae normal.  Cardiovascular:     Rate and Rhythm: Normal rate and regular rhythm.     Pulses: Normal pulses.     Heart sounds: Normal heart sounds.  Pulmonary:     Effort: Pulmonary effort is normal.     Breath sounds: Normal breath sounds.  Musculoskeletal:     Right shoulder: Normal.     Left shoulder: Normal.     Right upper arm: Normal.     Left upper arm: Normal.     Right elbow: Normal.     Left elbow: Normal.     Right forearm: Normal.     Left forearm: Normal.     Right wrist: Normal.     Left wrist: Normal.     Right hand: Normal.     Left hand: Normal.     Cervical back: Pain with movement and muscular  tenderness present. No spinous process tenderness. Decreased range of motion.     Thoracic back: Tenderness present. No swelling, deformity or bony tenderness. Normal range of motion.     Lumbar back: Normal.     Comments: Tenderness to palpation to left neck muscles that radiates into left trapezius to paraspinal muscles of thoracic back.  Neurological:     General: No focal deficit present.     Mental Status: She is alert and oriented to person, place, and time. Mental status is at baseline.  Psychiatric:        Mood and Affect: Mood normal.        Behavior: Behavior normal.        Thought Content: Thought content normal.        Judgment: Judgment normal.     UC Treatments / Results  Labs (all labs ordered are listed, but only abnormal results are displayed) Labs Reviewed - No data to display  EKG   Radiology No results found.  Procedures Procedures (including critical care time)  Medications Ordered in UC Medications - No data to display  Initial Impression / Assessment and Plan / UC Course  I have reviewed the triage vital signs and the nursing notes.  Pertinent labs & imaging results that were available during my care of the patient were reviewed by me and considered in my medical decision making (see chart for details).     Suspect neck muscular strain that is radiating down the back and into the left arm.  Will prescribe course of steroids as over-the-counter NSAIDs have not been helpful in alleviating pain.  Patient also prescribed muscle relaxer to take as needed.  Advised patient that muscle relaxer can cause drowsiness.  Patient to continue to alternate ice and heat application.Discussed strict return precautions. Patient verbalized understanding and is agreeable with plan.  Final Clinical Impressions(s) / UC Diagnoses   Final diagnoses:  Strain of neck muscle, initial encounter  Acute left-sided thoracic back pain     Discharge Instructions      You  likely have neck strain.  You were prescribed prednisone steroid to decrease inflammation and cyclobenzaprine to take as needed for muscle spasms.  Please be advised that the muscle relaxer can cause drowsiness so do not drive while taking this medication.  Alternate ice and heat application as well.  Do not take any over-the-counter ibuprofen, Advil or Aleve while taking steroid.     ED Prescriptions     Medication Sig Dispense Auth. Provider   predniSONE (DELTASONE) 20 MG tablet Take 2 tablets (40 mg total) by mouth daily for 5 days. 10 tablet Odis Luster, FNP   cyclobenzaprine (FLEXERIL) 10 MG tablet Take 0.5 tablets (5 mg total) by mouth 2 (two) times daily as needed for muscle spasms. 20 tablet Odis Luster, FNP      I have reviewed the PDMP during this encounter.   Odis Luster, FNP 06/05/21 1143

## 2021-06-05 NOTE — ED Triage Notes (Signed)
Pt c/o 10/10 pain in head, neck, left arm, down left back that stops around mid-back described as sharp and shooting. States no direct injury, she woke up after work and was experiencing these symptoms. Has tried aleve, motrin, heat and ice at home without relief. 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

## 2021-06-05 NOTE — Discharge Instructions (Addendum)
You likely have neck strain.  You were prescribed prednisone steroid to decrease inflammation and cyclobenzaprine to take as needed for muscle spasms.  Please be advised that the muscle relaxer can cause drowsiness so do not drive while taking this medication.  Alternate ice and heat application as well.  Do not take any over-the-counter ibuprofen, Advil or Aleve while taking steroid.

## 2021-06-09 ENCOUNTER — Ambulatory Visit (INDEPENDENT_AMBULATORY_CARE_PROVIDER_SITE_OTHER): Payer: BLUE CROSS/BLUE SHIELD | Admitting: Orthopaedic Surgery

## 2021-06-09 ENCOUNTER — Encounter: Payer: Self-pay | Admitting: Orthopaedic Surgery

## 2021-06-09 ENCOUNTER — Ambulatory Visit: Payer: Self-pay

## 2021-06-09 VITALS — Ht 71.0 in

## 2021-06-09 DIAGNOSIS — G8929 Other chronic pain: Secondary | ICD-10-CM | POA: Diagnosis not present

## 2021-06-09 DIAGNOSIS — M25562 Pain in left knee: Secondary | ICD-10-CM

## 2021-06-09 DIAGNOSIS — M25561 Pain in right knee: Secondary | ICD-10-CM

## 2021-06-09 NOTE — Progress Notes (Signed)
Office Visit Note   Patient: Katherine Wu           Date of Birth: 1985-10-21           MRN: [id number] Visit Date: 06/09/2021              Requested by: No referring provider defined for this encounter. PCP: Patient, No Pcp Per (Inactive)   Assessment & Plan: Visit Diagnoses:  1. Chronic pain of both knees     Plan: Impression is bilateral knee pain secondary to patellofemoral dysfunction and overuse.  She understands that she may need to find a different type of work.  She does wear knee braces but they are ineffective therefore we provided her with PSO braces.  I have made a referral to outpatient PT for home exercise program.  She will use Voltaren gel as needed.  Follow-up as needed.  Follow-Up Instructions: Return if symptoms worsen or fail to improve.   Orders:  Orders Placed This Encounter  Procedures   XR KNEE 3 VIEW LEFT   XR KNEE 3 VIEW RIGHT   Ambulatory referral to Physical Therapy   No orders of the defined types were placed in this encounter.     Procedures: No procedures performed   Clinical Data: No additional findings.   Subjective: Chief Complaint  Patient presents with   Right Knee - Pain   Left Knee - Pain    Katherine is a 35 year old female here for evaluation of bilateral knee pain for about a year on the left side and 3 months on the right side.  Denies any injuries or previous surgeries.  She has pain throughout her knees with subjective swelling towards the end of the day.  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  She feels a slight popping with pain and flexion of the knee increases the pain.  She takes Aleve as needed.  Denies any mechanical symptoms.   Review of Systems  Constitutional: Negative.   HENT: Negative.    Eyes: Negative.   Respiratory: Negative.    Cardiovascular: Negative.   Endocrine: Negative.   Musculoskeletal: Negative.   Neurological: Negative.    Hematological: Negative.   Psychiatric/Behavioral: Negative.    All other systems reviewed and are negative.   Objective: Vital Signs: Ht '5\' 11"'$  (1.803 m)   LMP 06/01/2021 (Exact Date)   BMI 27.75 kg/m   Physical Exam Vitals and nursing note reviewed.  Constitutional:      Appearance: She is well-developed.  Pulmonary:     Effort: Pulmonary effort is normal.  Skin:    General: Skin is warm.     Capillary Refill: Capillary refill takes less than 2 seconds.  Neurological:     Mental Status: She is alert and oriented to person, place, and time.  Psychiatric:        Behavior: Behavior normal.        Thought Content: Thought content normal.        Judgment: Judgment normal.    Ortho Exam Bilateral knees show slightly greater than 2 quadrants of patellar mobility.  Mild patella alta.  Range of motion is normal.  No joint effusion.  No joint tenderness.  Collaterals and cruciates are stable. Specialty Comments:  No specialty comments available.  Imaging: XR KNEE 3 VIEW LEFT  Result Date: 06/09/2021 Patella alta.  No acute abnormalities.  XR KNEE 3 VIEW RIGHT  Result Date: 06/09/2021 Patella alta.  No acute abnormalities.    PMFS History: Patient Active Problem List   Diagnosis Date Noted   Dysmenorrhea 11/19/2020   History of uterine fibroid 08/15/2020   Past Medical History:  Diagnosis Date   Asthma    Transient hypertension     History reviewed. No pertinent family history.  Past Surgical History:  Procedure Laterality Date   NO PAST SURGERIES     Social History   Occupational History   Not on file  Tobacco Use   Smoking status: Every Day    Types: Cigarettes    Last attempt to quit: 04/25/2016    Years since quitting: 5.1   Smokeless tobacco: Never  Substance and Sexual Activity   Alcohol use: Yes   Drug use: No   Sexual activity: Yes

## 2021-06-23 ENCOUNTER — Ambulatory Visit: Payer: BLUE CROSS/BLUE SHIELD | Admitting: Physical Therapy

## 2021-07-24 ENCOUNTER — Encounter: Payer: Self-pay | Admitting: Obstetrics and Gynecology

## 2021-07-24 ENCOUNTER — Ambulatory Visit (INDEPENDENT_AMBULATORY_CARE_PROVIDER_SITE_OTHER): Payer: BLUE CROSS/BLUE SHIELD | Admitting: Obstetrics and Gynecology

## 2021-07-24 ENCOUNTER — Other Ambulatory Visit: Payer: Self-pay

## 2021-07-24 VITALS — BP 129/96 | HR 86 | Wt 206.9 lb

## 2021-07-24 DIAGNOSIS — I10 Essential (primary) hypertension: Secondary | ICD-10-CM | POA: Insufficient documentation

## 2021-07-24 DIAGNOSIS — Z86018 Personal history of other benign neoplasm: Secondary | ICD-10-CM

## 2021-07-24 DIAGNOSIS — R030 Elevated blood-pressure reading, without diagnosis of hypertension: Secondary | ICD-10-CM

## 2021-07-24 DIAGNOSIS — R102 Pelvic and perineal pain: Secondary | ICD-10-CM

## 2021-07-24 NOTE — Progress Notes (Signed)
Patient is here for follow up on fibroids. She stated that she as constant pain on left side of pelvis that prevents her from sleeping at night along with heavy bleeding on period, dizzy spells.

## 2021-07-24 NOTE — Progress Notes (Signed)
Obstetrics and Gynecology Visit Return Patient Evaluation  Appointment Date: 07/24/2021  Primary Care Provider: None  OBGYN Clinic: [facility]  Chief Complaint: worsened fibrods  History of Present Illness:  Katherine Wu is a 35 y.o. G0 with above CC.  Patient previously seen by me for symptomatic fibroids.    Last visit was 11/2020 for fibroid f/u and patient was to let me know how she would like to proceed after options d/w her.  She says that her periods are still 4-5 days, heavy and painful and sometimes feels dizzy and pain when she lays on her side and +constipation and previously she had just some pelvic pressure and increased frequency but her s/s were not bad or distress; pt is self pay and opted for expected management.  Review of Systems: as noted in the History of Present Illness.  Patient Active Problem List   Diagnosis Date Noted   Transient hypertension 07/24/2021   Dysmenorrhea 11/19/2020   History of uterine fibroid 08/15/2020   Medications:  Katherine D. Holton had no medications administered during this visit. Current Outpatient Medications  Medication Sig Dispense Refill   albuterol (PROVENTIL HFA;VENTOLIN HFA) 108 (90 Base) MCG/ACT inhaler Inhale 2 puffs into the lungs every 6 (six) hours as needed for wheezing or shortness of breath.     cyclobenzaprine (FLEXERIL) 10 MG tablet Take 0.5 tablets (5 mg total) by mouth 2 (two) times daily as needed for muscle spasms. (Patient not taking: Reported on 07/24/2021) 20 tablet 0   No current facility-administered medications for this visit.    Allergies: has No Known Allergies.  Physical Exam:  BP (!) 129/96   Pulse 86   Wt 206 lb 14.4 oz (93.8 kg)   LMP 07/22/2021 (Exact Date)   BMI 28.86 kg/m  Body mass index is 28.86 kg/m. General appearance: Well nourished, well developed female in no acute distress.  Abdomen: diffusely non tender to palpation, non distended, and no masses,  hernias Neuro/Psych:  Normal mood and affect.     Assessment: pt stable  Plan:  1. Transient hypertension - TSH - Comprehensive metabolic panel - CBC - Hemoglobin A1c - Amylase - Lipase  2. History of uterine fibroid Follow up repeat u/s; options tentatively d/s her - US PELVIC COMPLETE WITH TRANSVAGINAL; Future  3. Pelvic pain - US PELVIC COMPLETE WITH TRANSVAGINAL; Future - TSH - Comprehensive metabolic panel - CBC - Hemoglobin A1c - Amylase - Lipase   RTC: after u/s  Durene Romans MD Attending Center for [facility])

## 2021-07-25 LAB — AMYLASE: Amylase: 51 U/L (ref 31–110)

## 2021-07-25 LAB — COMPREHENSIVE METABOLIC PANEL
ALT: 11 IU/L (ref 0–32)
AST: 16 IU/L (ref 0–40)
Albumin/Globulin Ratio: 2.2 (ref 1.2–2.2)
Albumin: 3.9 g/dL (ref 3.8–4.8)
Alkaline Phosphatase: 55 IU/L (ref 44–121)
BUN/Creatinine Ratio: 9 (ref 9–23)
BUN: 7 mg/dL (ref 6–20)
Bilirubin Total: 0.3 mg/dL (ref 0.0–1.2)
CO2: 25 mmol/L (ref 20–29)
Calcium: 8.8 mg/dL (ref 8.7–10.2)
Chloride: 103 mmol/L (ref 96–106)
Creatinine, Ser: 0.76 mg/dL (ref 0.57–1.00)
Globulin, Total: 1.8 g/dL (ref 1.5–4.5)
Glucose: 97 mg/dL (ref 70–99)
Potassium: 3.9 mmol/L (ref 3.5–5.2)
Sodium: 140 mmol/L (ref 134–144)
Total Protein: 5.7 g/dL — ABNORMAL LOW (ref 6.0–8.5)
eGFR: 105 mL/min/{1.73_m2} (ref >59)

## 2021-07-25 LAB — TSH: TSH: 1.04 u[IU]/mL (ref 0.450–4.500)

## 2021-07-25 LAB — CBC
Hematocrit: 38.5 % (ref 34.0–46.6)
Hemoglobin: 13 g/dL (ref 11.1–15.9)
MCH: 30.5 pg (ref 26.6–33.0)
MCHC: 33.8 g/dL (ref 31.5–35.7)
MCV: 90 fL (ref 79–97)
Platelets: 278 10*3/uL (ref 150–450)
RBC: 4.26 x10E6/uL (ref 3.77–5.28)
RDW: 12 % (ref 11.7–15.4)
WBC: 4.8 10*3/uL (ref 3.4–10.8)

## 2021-07-25 LAB — HEMOGLOBIN A1C
Est. average glucose Bld gHb Est-mCnc: 97 mg/dL
Hgb A1c MFr Bld: 5 % (ref 4.8–5.6)

## 2021-07-25 LAB — LIPASE: Lipase: 16 U/L (ref 14–72)

## 2021-07-29 ENCOUNTER — Ambulatory Visit
Admission: RE | Admit: 2021-07-29 | Discharge: 2021-07-29 | Disposition: A | Discharge status: Home / Self Care | Payer: Self-pay | Source: Ambulatory Visit | Attending: Obstetrics and Gynecology | Admitting: Obstetrics and Gynecology

## 2021-07-29 ENCOUNTER — Other Ambulatory Visit: Payer: Self-pay

## 2021-07-29 DIAGNOSIS — R102 Pelvic and perineal pain: Secondary | ICD-10-CM | POA: Insufficient documentation

## 2021-07-29 DIAGNOSIS — Z86018 Personal history of other benign neoplasm: Secondary | ICD-10-CM | POA: Insufficient documentation

## 2021-08-31 ENCOUNTER — Ambulatory Visit: Payer: Self-pay | Admitting: Obstetrics and Gynecology

## 2021-09-07 ENCOUNTER — Ambulatory Visit: Payer: Self-pay | Admitting: Obstetrics and Gynecology

## 2021-10-15 ENCOUNTER — Encounter (HOSPITAL_COMMUNITY): Payer: Self-pay | Admitting: Emergency Medicine

## 2021-10-15 ENCOUNTER — Ambulatory Visit (HOSPITAL_COMMUNITY)
Admission: EM | Admit: 2021-10-15 | Discharge: 2021-10-15 | Disposition: A | Discharge status: Home / Self Care | Payer: Self-pay | Attending: Student | Admitting: Student

## 2021-10-15 ENCOUNTER — Other Ambulatory Visit: Payer: Self-pay

## 2021-10-15 DIAGNOSIS — J4521 Mild intermittent asthma with (acute) exacerbation: Secondary | ICD-10-CM

## 2021-10-15 DIAGNOSIS — F17200 Nicotine dependence, unspecified, uncomplicated: Secondary | ICD-10-CM

## 2021-10-15 MED ORDER — ALBUTEROL SULFATE HFA 108 (90 BASE) MCG/ACT IN AERS: 1.0000 | INHALATION_SPRAY | Freq: Four times a day (QID) | RESPIRATORY_TRACT | 0 refills | Status: DC | PRN

## 2021-10-15 MED ORDER — PREDNISONE 20 MG PO TABS: 40.0000 mg | ORAL_TABLET | Freq: Every day | ORAL | 0 refills | Status: AC

## 2021-10-15 MED ORDER — AMOXICILLIN-POT CLAVULANATE 875-125 MG PO TABS: 1.0000 | ORAL_TABLET | Freq: Two times a day (BID) | ORAL | 0 refills | Status: DC

## 2021-10-15 NOTE — ED Provider Notes (Signed)
[facility]    CSN: [id number] Arrival date & time: 10/15/21  1001      History   Chief Complaint Chief Complaint  Patient presents with   Cough   Chest congestion    HPI [place] D Go is a 36 y.o. female presenting with cough and sinus pressure x2 weeks, initially with nasal congestion and now with productive cough x1 week. Medical history asthma, currently poorly controlled on albuterol inhaler only. Nasal congestion is clear, some sinus pressure, some PND. Wheezing and productive cough - yellow sputum. SOB only with coughing. Has tried Copywriter, advertising, delsym with some relief. Using albuterol inhaler q3-4h with temporary relief. Current smoker. Denies CP, fevers/chills, myalgias, n/v/d/c.   HPI  Past Medical History:  Diagnosis Date   Asthma    Transient hypertension     Patient Active Problem List   Diagnosis Date Noted   Transient hypertension 07/24/2021   Dysmenorrhea 11/19/2020   History of uterine fibroid 08/15/2020    Past Surgical History:  Procedure Laterality Date   NO PAST SURGERIES      OB History     Gravida  0   Para  0   Term  0   Preterm  0   AB  0   Living  0      SAB  0   IAB  0   Ectopic  0   Multiple  0   Live Births  0            Home Medications    Prior to Admission medications   Medication Sig Start Date End Date Taking? Authorizing Provider  albuterol (VENTOLIN HFA) 108 (90 Base) MCG/ACT inhaler Inhale 1-2 puffs into the lungs every 6 (six) hours as needed for wheezing or shortness of breath. 10/15/21  Yes Hazel Sams, PA-C  amoxicillin-clavulanate (AUGMENTIN) 875-125 MG tablet Take 1 tablet by mouth every 12 (twelve) hours. 10/15/21  Yes Hazel Sams, PA-C  predniSONE (DELTASONE) 20 MG tablet Take 2 tablets (40 mg total) by mouth daily for 5 days. Take with breakfast or lunch. Avoid NSAIDs (ibuprofen, etc) while taking this medication. 10/15/21 10/20/21 Yes Hazel Sams, PA-C    Family  History History reviewed. No pertinent family history.  Social History Social History   Tobacco Use   Smoking status: Every Day    Types: Cigarettes    Last attempt to quit: 04/25/2016    Years since quitting: 5.4   Smokeless tobacco: Never  Substance Use Topics   Alcohol use: Yes   Drug use: No     Allergies   Patient has no known allergies.   Review of Systems Review of Systems  Constitutional:  Negative for appetite change, chills and fever.  HENT:  Positive for congestion, postnasal drip and sinus pressure. Negative for ear pain, rhinorrhea, sinus pain and sore throat.   Eyes:  Negative for redness and visual disturbance.  Respiratory:  Positive for cough, shortness of breath and wheezing. Negative for chest tightness.   Cardiovascular:  Negative for chest pain and palpitations.  Gastrointestinal:  Negative for abdominal pain, constipation, diarrhea, nausea and vomiting.  Genitourinary:  Negative for dysuria, frequency and urgency.  Musculoskeletal:  Negative for myalgias.  Neurological:  Negative for dizziness, weakness and headaches.  Psychiatric/Behavioral:  Negative for confusion.   All other systems reviewed and are negative.   Physical Exam Triage Vital Signs ED Triage Vitals [10/15/21 1056]  Enc Vitals Group     BP  Pulse      Resp      Temp      Temp src      SpO2      Weight      Height      Head Circumference      Peak Flow      Pain Score 5     Pain Loc      Pain Edu?      Excl. in [place]?    No data found.  Updated Vital Signs BP (!) 151/99 (BP Location: Left Arm)    Pulse 63    Temp 98.7 F (37.1 C) (Oral)    Resp 16    LMP 10/01/2021 (Approximate)    SpO2 99%   Visual Acuity Right Eye Distance:   Left Eye Distance:   Bilateral Distance:    Right Eye Near:   Left Eye Near:    Bilateral Near:     Physical Exam Vitals reviewed.  Constitutional:      General: She is not in acute distress.    Appearance: Normal appearance. She is  not ill-appearing.  HENT:     Head: Normocephalic and atraumatic.     Right Ear: Tympanic membrane, ear canal and external ear normal. No tenderness. No middle ear effusion. There is no impacted cerumen. Tympanic membrane is not perforated, erythematous, retracted or bulging.     Left Ear: Tympanic membrane, ear canal and external ear normal. No tenderness.  No middle ear effusion. There is no impacted cerumen. Tympanic membrane is not perforated, erythematous, retracted or bulging.     Nose: Nose normal. No congestion.     Mouth/Throat:     Mouth: Mucous membranes are moist.     Pharynx: Uvula midline. Posterior oropharyngeal erythema present. No oropharyngeal exudate.     Comments: Smooth erythema posterior pharynx. Tonsils not visible. Eyes:     Extraocular Movements: Extraocular movements intact.     Pupils: Pupils are equal, round, and reactive to light.  Cardiovascular:     Rate and Rhythm: Normal rate and regular rhythm.     Heart sounds: Normal heart sounds.  Pulmonary:     Effort: Pulmonary effort is normal.     Breath sounds: Decreased breath sounds and wheezing present. No rhonchi or rales.     Comments: Decreased breath sounds and expiratory wheezing throughout  Abdominal:     Palpations: Abdomen is soft.     Tenderness: There is no abdominal tenderness. There is no guarding or rebound.  Lymphadenopathy:     Cervical: No cervical adenopathy.     Right cervical: No superficial cervical adenopathy.    Left cervical: No superficial cervical adenopathy.  Neurological:     General: No focal deficit present.     Mental Status: She is alert and oriented to person, place, and time.  Psychiatric:        Mood and Affect: Mood normal.        Behavior: Behavior normal.        Thought Content: Thought content normal.        Judgment: Judgment normal.     UC Treatments / Results  Labs (all labs ordered are listed, but only abnormal results are displayed) Labs Reviewed - No data  to display  EKG   Radiology No results found.  Procedures Procedures (including critical care time)  Medications Ordered in UC Medications - No data to display  Initial Impression / Assessment and Plan / UC Course  I have reviewed the triage vital signs and the nursing notes.  Pertinent labs & imaging results that were available during my care of the patient were reviewed by me and considered in my medical decision making (see chart for details).     This patient is a very pleasant 36 y.o. year old female presenting with sinusitis and asthma exacerbation following viral URI x2 weeks. Afebrile, nontachy. Wheezes throughout. States she is not pregnant or breastfeeding. Augmentin, prednisone, albuterol sent. ED return precautions discussed. Patient verbalizes understanding and agreement. Coding Level 4 for acute exacerbation of chronic condition, and prescription drug management   Final Clinical Impressions(s) / UC Diagnoses   Final diagnoses:  Mild intermittent asthma with acute exacerbation  Current smoker     Discharge Instructions      -Albuterol inhaler as needed for cough, wheezing, shortness of breath, 1 to 2 puffs every 6 hours as needed. -Start the antibiotic-Augmentin (amoxicillin-clavulanate), 1 pill every 12 hours for 7 days.  You can take this with food like with breakfast and dinner. -Prednisone, 2 pills taken at the same time for 5 days in a row.  Try taking this earlier in the day as it can give you energy. Avoid NSAIDs like ibuprofen and alleve while taking this medication as they can increase your risk of stomach upset and even GI bleeding when in combination with a steroid. You can continue tylenol (acetaminophen) up to 1000mg  3x daily.      ED Prescriptions     Medication Sig Dispense Auth. Provider   albuterol (VENTOLIN HFA) 108 (90 Base) MCG/ACT inhaler Inhale 1-2 puffs into the lungs every 6 (six) hours as needed for wheezing or shortness of breath. 1  each Hazel Sams, PA-C   predniSONE (DELTASONE) 20 MG tablet Take 2 tablets (40 mg total) by mouth daily for 5 days. Take with breakfast or lunch. Avoid NSAIDs (ibuprofen, etc) while taking this medication. 10 tablet Hazel Sams, PA-C   amoxicillin-clavulanate (AUGMENTIN) 875-125 MG tablet Take 1 tablet by mouth every 12 (twelve) hours. 14 tablet Hazel Sams, PA-C      PDMP not reviewed this encounter.   Hazel Sams, PA-C 10/15/21 1129

## 2021-10-15 NOTE — ED Triage Notes (Signed)
Patient c/o productive cough x 2 weeks. Patient c/o chest congestion x 1 week.   Patient endorses symptoms began with nasal congestion.   Patient endorses sinus pressure at times.   Patient has taken [place] with no relief of symptoms.   Patient endorses wheezing due to Asthma. Patient has used albuterol inhaler with no relief of symptoms.

## 2021-10-15 NOTE — Discharge Instructions (Addendum)
-  Albuterol inhaler as needed for cough, wheezing, shortness of breath, 1 to 2 puffs every 6 hours as needed. -Start the antibiotic-Augmentin (amoxicillin-clavulanate), 1 pill every 12 hours for 7 days.  You can take this with food like with breakfast and dinner. -Prednisone, 2 pills taken at the same time for 5 days in a row.  Try taking this earlier in the day as it can give you energy. Avoid NSAIDs like ibuprofen and alleve while taking this medication as they can increase your risk of stomach upset and even GI bleeding when in combination with a steroid. You can continue tylenol (acetaminophen) up to 1000mg  3x daily.

## 2021-11-25 ENCOUNTER — Other Ambulatory Visit: Payer: Self-pay

## 2021-11-25 ENCOUNTER — Encounter: Payer: Self-pay | Admitting: Family Medicine

## 2021-11-25 ENCOUNTER — Ambulatory Visit (INDEPENDENT_AMBULATORY_CARE_PROVIDER_SITE_OTHER): Payer: Self-pay | Admitting: Family Medicine

## 2021-11-25 ENCOUNTER — Other Ambulatory Visit (HOSPITAL_COMMUNITY)
Admission: RE | Admit: 2021-11-25 | Discharge: 2021-11-25 | Disposition: A | Discharge status: Home / Self Care | Payer: BC Managed Care – PPO | Source: Ambulatory Visit | Attending: Obstetrics and Gynecology | Admitting: Obstetrics and Gynecology

## 2021-11-25 ENCOUNTER — Telehealth: Payer: Self-pay | Admitting: Family Medicine

## 2021-11-25 VITALS — BP 148/97 | HR 63 | Ht 71.0 in | Wt 217.3 lb

## 2021-11-25 DIAGNOSIS — Z86018 Personal history of other benign neoplasm: Secondary | ICD-10-CM

## 2021-11-25 DIAGNOSIS — N898 Other specified noninflammatory disorders of vagina: Secondary | ICD-10-CM | POA: Insufficient documentation

## 2021-11-25 DIAGNOSIS — I10 Essential (primary) hypertension: Secondary | ICD-10-CM

## 2021-11-25 DIAGNOSIS — Z3189 Encounter for other procreative management: Secondary | ICD-10-CM

## 2021-11-25 MED ORDER — AMLODIPINE BESYLATE 5 MG PO TABS: 5.0000 mg | ORAL_TABLET | Freq: Every day | ORAL | 3 refills | Status: DC

## 2021-11-25 NOTE — Telephone Encounter (Signed)
Patient noted to be hypertensive during visit and on review of chart now meets criteria for essential hypertension.  Please call and inform the patient of this, let her know I have sent amlodipine to the pharmacy, and that good control of her BP now will give her the best chance of achieving a healthy pregnancy.

## 2021-11-25 NOTE — Assessment & Plan Note (Signed)
Patient strongly desires pregnancy in the near future. Images reviewed, luckily her fibroids appear to be peripheral and endometrial lining does not appear to be distorted in any significant way. In addition, patient is likely ovulating based on her moliminal symptoms. I discussed with her though that this is not my area of expertise and I would recommend consultation with fertility doctor to get their opinion on fibroid debulking prior to conception as well as assistance with IUI/sperm donation, she was amenable to this referral. I also recommended she confirm ovulation by purchasing ovulation predictor kits and I counseled her in their use. Also recommended she start closely tracking her menstrual cycle using an app.

## 2021-11-25 NOTE — Progress Notes (Signed)
GYNECOLOGY OFFICE VISIT NOTE  History:   Katherine Wu is a 36 y.o. G0P0000 here today for pre-conception counseling in setting of known large fibroids.  Last saw Dr. Ilda Basset on 07/24/21, tentatively discussed options for fibroid removal at that visit with plan to follow up in a few weeks  Today reports she strongly desires a pregnancy but is worried about impact of fibroids on ability to get pregnant Has never been pregnant Has only had female partners Never had intercourse with sperm producing partner, has never previously attempted to get pregnant in any fashion Period is somewhat irregular, but usually has it within the first 5-6 days of each month Gets breast pain, low back pain, and fatigue before her period Has never had any STI that she is aware of  Health Maintenance Due  Topic Date Due   Hepatitis C Screening  Never done    Past Medical History:  Diagnosis Date   Asthma    Transient hypertension     Past Surgical History:  Procedure Laterality Date   NO PAST SURGERIES      The following portions of the patient's history were reviewed and updated as appropriate: allergies, current medications, past family history, past medical history, past social history, past surgical history and problem list.   Health Maintenance:   Last pap: No results found for: DIAGPAP, HPV, [place] Last pap on file is in Media tab from 08/11/2021 Scan of lab report from pap collected on 04/21/2020 reports that pap was unsatisfactory for evaluation due to insufficient cellularity  Last mammogram:  N/a    Review of Systems:  Pertinent items noted in HPI and remainder of comprehensive ROS otherwise negative.  Physical Exam:  BP (!) 148/97    Pulse 63    Ht 5\' 11"  (1.803 m)    Wt 217 lb 4.8 oz (98.6 kg)    LMP 11/20/2021 (Exact Date)    BMI 30.31 kg/m  CONSTITUTIONAL: Well-developed, well-nourished female in no acute distress.  HEENT:  Normocephalic, atraumatic. External right and left  ear normal. No scleral icterus.  NECK: Normal range of motion, supple, no masses noted on observation SKIN: No rash noted. Not diaphoretic. No erythema. No pallor. MUSCULOSKELETAL: Normal range of motion. No edema noted. NEUROLOGIC: Alert and oriented to person, place, and time. Normal muscle tone coordination.  PSYCHIATRIC: Normal mood and affect. Normal behavior. Normal judgment and thought content. RESPIRATORY: Effort normal, no problems with respiration noted ABDOMEN: Bulky fibroids palpated above the pelvic brim but below the umbilicus PELVIC: Deferred  Labs and Imaging No results found for this or any previous visit (from the past 168 hour(s)). No results found.    Assessment and Plan:   Problem List Items Addressed This Visit       Cardiovascular and Mediastinum   Essential hypertension    Not discussed with patient at visit but at this point meets criteria for HTN. Will start TE to inform patient and have her start amlodipine.         Other   History of uterine fibroid   Encounter for fertility planning - Primary    Patient strongly desires pregnancy in the near future. Images reviewed, luckily her fibroids appear to be peripheral and endometrial lining does not appear to be distorted in any significant way. In addition, patient is likely ovulating based on her moliminal symptoms. I discussed with her though that this is not my area of expertise and I would recommend consultation with fertility doctor to  get their opinion on fibroid debulking prior to conception as well as assistance with IUI/sperm donation, she was amenable to this referral. I also recommended she confirm ovulation by purchasing ovulation predictor kits and I counseled her in their use. Also recommended she start closely tracking her menstrual cycle using an app.       Relevant Orders   Ambulatory referral to Infertility   Other Visit Diagnoses     Vaginal discharge       Relevant Orders   Cervicovaginal  ancillary only( [place])      Patient desired vaginal swab as well for discharge, this was collected.  Due for pap as last one done in [place] in 2021 was unsatisfactory for evaluation but patient unable to do it today, agreed to return in one month for repeat pap.     Return in about 4 weeks (around 12/23/2021) for repeat pap.    Total face-to-face time with patient: 20 minutes.  Over 50% of encounter was spent on counseling and coordination of care.   Clarnce Flock, MD/MPH Attending Family Medicine Physician, [facility] [place]

## 2021-11-25 NOTE — Assessment & Plan Note (Signed)
Not discussed with patient at visit but at this point meets criteria for HTN. Will start TE to inform patient and have her start amlodipine.

## 2021-11-26 LAB — CERVICOVAGINAL ANCILLARY ONLY
Bacterial Vaginitis (gardnerella): POSITIVE — AB
Candida Glabrata: NEGATIVE
Candida Vaginitis: NEGATIVE
Chlamydia: NEGATIVE
Comment: NEGATIVE
Comment: NEGATIVE
Comment: NEGATIVE
Comment: NEGATIVE
Comment: NEGATIVE
Comment: NORMAL
Neisseria Gonorrhea: NEGATIVE
Trichomonas: NEGATIVE

## 2021-11-26 MED ORDER — METRONIDAZOLE 500 MG PO TABS: 500.0000 mg | ORAL_TABLET | Freq: Two times a day (BID) | ORAL | 0 refills | Status: AC

## 2021-11-26 NOTE — Addendum Note (Signed)
Addended by: Clayton Lefort on: 11/26/2021 01:43 PM   Modules accepted: Orders

## 2021-11-27 NOTE — Telephone Encounter (Signed)
Called pt. Pt is hesitant to begin BP med because this has never been a problem before. Reviewed past history of elevated BP in our office. Explained Dr. Dione Plover is recommending this to improve her chances of a healthy pregnancy. Offered BP check in a few weeks after beginning medication. Recommended pt purchase BP cuff for home to confirm elevated BP and to track BP after beginning medication. Pt will follow up for BP check if desired.

## 2021-12-28 ENCOUNTER — Other Ambulatory Visit (HOSPITAL_COMMUNITY)
Admission: RE | Admit: 2021-12-28 | Discharge: 2021-12-28 | Disposition: A | Discharge status: Home / Self Care | Payer: BC Managed Care – PPO | Source: Ambulatory Visit | Attending: Family Medicine | Admitting: Family Medicine

## 2021-12-28 ENCOUNTER — Ambulatory Visit (INDEPENDENT_AMBULATORY_CARE_PROVIDER_SITE_OTHER): Payer: BC Managed Care – PPO | Admitting: Family Medicine

## 2021-12-28 ENCOUNTER — Encounter: Payer: Self-pay | Admitting: Family Medicine

## 2021-12-28 ENCOUNTER — Other Ambulatory Visit: Payer: Self-pay

## 2021-12-28 VITALS — BP 135/101 | HR 75 | Wt 216.0 lb

## 2021-12-28 DIAGNOSIS — F17200 Nicotine dependence, unspecified, uncomplicated: Secondary | ICD-10-CM | POA: Diagnosis not present

## 2021-12-28 DIAGNOSIS — Z01419 Encounter for gynecological examination (general) (routine) without abnormal findings: Secondary | ICD-10-CM | POA: Diagnosis not present

## 2021-12-28 DIAGNOSIS — Z124 Encounter for screening for malignant neoplasm of cervix: Secondary | ICD-10-CM | POA: Insufficient documentation

## 2021-12-28 DIAGNOSIS — I10 Essential (primary) hypertension: Secondary | ICD-10-CM | POA: Diagnosis not present

## 2021-12-28 NOTE — Progress Notes (Signed)
? ? ?GYNECOLOGY ANNUAL PREVENTATIVE CARE ENCOUNTER NOTE ? ?History:    ? Katherine Wu is a 36 y.o. G0P0000 female here for a routine annual gynecologic exam.   ? ?Her last pap smear was reported to be in 2021 but do not have records. Patient can not remember the results.  ? ?She has a history of likely essential hypertension. Elevated today and during last visit, in addition to several office/ED visits in the past. Prescribed norvasc last visit by Dr. Dione Plover, but she hasn't started it because she was worried about being on medication. Has a BP cuff at home but hasn't taken her BP at home. She is a current tobacco smoker, about 3-4 cigarettes daily.  ? ?Awaiting fertility referral, trying to conceive.  Discussed at length with Dr. Dione Plover during last visit. She has know multiple large fibroids and worries this are impacting her ability to become pregnant.  ? ?  ?Gynecologic History ?No LMP recorded. ?Contraception:  None ?Last mammogram: Never. No family history of breast cancer.  ? ?Obstetric History ?OB History  ?Gravida Para Term Preterm AB Living  ?0 0 0 0 0 0  ?SAB IAB Ectopic Multiple Live Births  ?0 0 0 0 0  ? ? ?Past Medical History:  ?Diagnosis Date  ? Asthma   ? Transient hypertension   ? ? ?Past Surgical History:  ?Procedure Laterality Date  ? NO PAST SURGERIES    ? ? ?Current Outpatient Medications on File Prior to Visit  ?Medication Sig Dispense Refill  ? albuterol (VENTOLIN HFA) 108 (90 Base) MCG/ACT inhaler Inhale 1-2 puffs into the lungs every 6 (six) hours as needed for wheezing or shortness of breath. 1 each 0  ? amLODipine (NORVASC) 5 MG tablet Take 1 tablet (5 mg total) by mouth daily. 90 tablet 3  ? ?No current facility-administered medications on file prior to visit.  ? ? ?No Known Allergies ? ?Social History:  reports that she has been smoking cigarettes. She has never used smokeless tobacco. She reports current alcohol use. She reports that she does not use drugs. ? ?No family history on  file. ? ?The following portions of the patient's history were reviewed and updated as appropriate: allergies, current medications, past family history, past medical history, past social history, past surgical history and problem list. ? ?Review of Systems ?Pertinent items noted in HPI and remainder of comprehensive ROS otherwise negative. ? ?Physical Exam:  ?BP (!) 135/101   Pulse 75   Wt 216 lb (98 kg)   BMI 30.13 kg/m?  ?CONSTITUTIONAL: Well-developed, well-nourished female in no acute distress.  ?HENT:  Normocephalic, atraumatic. Oropharynx is clear and moist ?EYES: Conjunctivae and EOM are normal. ?NECK: Normal range of motion, supple, no masses.  Normal thyroid.  ?SKIN: Skin is warm and dry. No pallor. ?MUSCULOSKELETAL: Normal range of motion. 2+ distal pulses. ?NEUROLOGIC: Alert and oriented to person, place, and time.  ?PSYCHIATRIC: Normal mood and affect. Normal behavior.  ?CARDIOVASCULAR: Normal heart rate noted ?RESPIRATORY: Effort normal.  ?ABDOMEN: Soft, no distention noted.  No tenderness. ?PELVIC: Normal appearing external genitalia and urethral meatus; normal appearing vaginal mucosa and cervix.  No abnormal discharge noted.  Cervix quite posterior and tilted downward at a 90 degree angle (distorted positioning with fibroids) within the canal making visualization difficult. Used a fox swab to hold up cervix while obtaining pap smear. Enlarged uterus, no uterine or adnexal tenderness.  Performed in the presence of a chaperone. ?  ?Assessment and Plan:  ? ?1.  Encounter for Papanicolaou smear of cervix ?Difficulty obtaining pap, hopeful enough of transformation zone was collected. Will follow results.  ?- Cytology - PAP( [place]) ? ?2. Essential hypertension ?Elevated today. Strongly encouraged tobacco cessation for long term CV health and fertility likelihood. She is hesitant for medication at this time, discussed monitoring BP at home and keeping a journal with goal BP <130/80. Encourage  physical activity and balancing her diet.  ?- Ambulatory referral to Family Practice ? ?3. Tobacco use disorder ?3-4 cigarettes daily. Considering quitting. Discussed tapering cigarettes weekly and that we can always add medication to decrease cravings if needed.   ? ?Will follow up results of pap smear and manage accordingly. ?Routine preventative health maintenance measures emphasized. ?   ? ?Darrelyn Hillock, DO  ?OB Fellow, Faculty Practice ?[place] [facility] ?12/28/2021 4:23 PM ?

## 2021-12-30 ENCOUNTER — Encounter: Payer: Self-pay | Admitting: Family Medicine

## 2021-12-31 LAB — CYTOLOGY - PAP
Adequacy: ABSENT
Comment: NEGATIVE
Diagnosis: NEGATIVE
High risk HPV: NEGATIVE

## 2022-03-10 ENCOUNTER — Emergency Department (HOSPITAL_BASED_OUTPATIENT_CLINIC_OR_DEPARTMENT_OTHER): Payer: BC Managed Care – PPO

## 2022-03-10 ENCOUNTER — Emergency Department (HOSPITAL_BASED_OUTPATIENT_CLINIC_OR_DEPARTMENT_OTHER)
Admission: EM | Admit: 2022-03-10 | Discharge: 2022-03-10 | Disposition: A | Discharge status: Home / Self Care | Payer: BC Managed Care – PPO | Attending: Emergency Medicine | Admitting: Emergency Medicine

## 2022-03-10 ENCOUNTER — Other Ambulatory Visit: Payer: Self-pay

## 2022-03-10 ENCOUNTER — Encounter (HOSPITAL_BASED_OUTPATIENT_CLINIC_OR_DEPARTMENT_OTHER): Payer: Self-pay

## 2022-03-10 DIAGNOSIS — S93492A Sprain of other ligament of left ankle, initial encounter: Secondary | ICD-10-CM | POA: Insufficient documentation

## 2022-03-10 DIAGNOSIS — S93432A Sprain of tibiofibular ligament of left ankle, initial encounter: Secondary | ICD-10-CM | POA: Diagnosis not present

## 2022-03-10 DIAGNOSIS — Y990 Civilian activity done for income or pay: Secondary | ICD-10-CM | POA: Insufficient documentation

## 2022-03-10 DIAGNOSIS — I10 Essential (primary) hypertension: Secondary | ICD-10-CM | POA: Insufficient documentation

## 2022-03-10 DIAGNOSIS — Z79899 Other long term (current) drug therapy: Secondary | ICD-10-CM | POA: Diagnosis not present

## 2022-03-10 DIAGNOSIS — J45909 Unspecified asthma, uncomplicated: Secondary | ICD-10-CM | POA: Diagnosis not present

## 2022-03-10 DIAGNOSIS — S99912A Unspecified injury of left ankle, initial encounter: Secondary | ICD-10-CM | POA: Diagnosis not present

## 2022-03-10 DIAGNOSIS — X58XXXA Exposure to other specified factors, initial encounter: Secondary | ICD-10-CM | POA: Diagnosis not present

## 2022-03-10 DIAGNOSIS — M25572 Pain in left ankle and joints of left foot: Secondary | ICD-10-CM | POA: Diagnosis not present

## 2022-03-10 MED ORDER — IBUPROFEN 600 MG PO TABS: 600.0000 mg | ORAL_TABLET | Freq: Four times a day (QID) | ORAL | 0 refills | Status: AC | PRN

## 2022-03-10 NOTE — Discharge Instructions (Addendum)
Wear ankle brace is much as possible.  This will help with feelings of instability as well as decrease likelihood of reinjuring the ankle.  Consider following up with orthopedics in 7 to 10 days if symptomatic therapy does not help your ankle.  I will prescribe ibuprofen to take consistently.  Ice, elevate, compress affected ankle.  I will also provide a work note for today.  Please do not hesitate to return to the emergency department for worrisome signs and symptoms we discussed compare.

## 2022-03-10 NOTE — ED Triage Notes (Signed)
Pt presents with 1 month hx of L ankle pain. Pt does not remember if she sustained an injury. Pt works long hours standing.

## 2022-03-10 NOTE — ED Provider Notes (Signed)
[facility] Provider Note   CSN: [id number] Arrival date & time: 03/10/22  1722     History  Chief Complaint  Patient presents with   Ankle Injury    Katherine Wu is a 36 y.o. female.   Ankle Injury Pertinent negatives include no chest pain, no abdominal pain, no headaches and no shortness of breath.   36 year old female presents emergency department with complaints of left ankle pain.  Patient states that ankle pain began about 1 month ago.  She is continue to walk on affected ankle at work with no relief of symptoms.  She has tried personally massaging at home with minimal to no relief.  Today at work, she noticed an increase in pain which prompted her visit to the emergency department.  She does not remember the mechanism of injury or note tripping, falling, twisting her ankle, but the pain has gotten progressively worse. Denies fever, chills, night sweats, chest pain, shortness of breath, abdominal pain, N/V/D, urinary/vaginal symptoms, change in bowel habits.  Home Medications Prior to Admission medications   Medication Sig Start Date End Date Taking? Authorizing Provider  ibuprofen (ADVIL) 600 MG tablet Take 1 tablet (600 mg total) by mouth every 6 (six) hours as needed. 03/10/22  Yes Dion Saucier A, PA  albuterol (VENTOLIN HFA) 108 (90 Base) MCG/ACT inhaler Inhale 1-2 puffs into the lungs every 6 (six) hours as needed for wheezing or shortness of breath. 10/15/21   Hazel Sams, PA-C  amLODipine (NORVASC) 5 MG tablet Take 1 tablet (5 mg total) by mouth daily. 11/25/21   Clarnce Flock, MD      Allergies    Patient has no known allergies.    Review of Systems   Review of Systems  Constitutional:  Negative for chills and fever.  HENT:  Negative for rhinorrhea, sinus pressure, sore throat, trouble swallowing and voice change.   Respiratory:  Negative for cough and shortness of breath.   Cardiovascular:  Negative for chest pain,  palpitations and leg swelling.  Gastrointestinal:  Negative for abdominal pain, diarrhea, nausea and vomiting.  Genitourinary:  Negative for difficulty urinating, dysuria, flank pain, frequency and hematuria.  Musculoskeletal:  Positive for arthralgias. Negative for myalgias.       Ankle pain  Skin:  Negative for rash and wound.  Neurological:  Negative for dizziness, seizures, syncope, weakness and headaches.  All other systems reviewed and are negative.  Physical Exam Updated Vital Signs BP (!) 150/101 (BP Location: Right Arm)   Pulse 75   Temp 98 F (36.7 C) (Oral)   Resp 18   Ht '5\' 11"'$  (1.803 m)   Wt 90.7 kg   LMP 03/02/2022   SpO2 100%   BMI 27.89 kg/m  Physical Exam Vitals and nursing note reviewed.  Constitutional:      General: She is not in acute distress.    Appearance: Normal appearance. She is not ill-appearing, toxic-appearing or diaphoretic.  HENT:     Head: Normocephalic and atraumatic.     Nose: Nose normal.     Mouth/Throat:     Mouth: Mucous membranes are moist.     Pharynx: Oropharynx is clear. No oropharyngeal exudate or posterior oropharyngeal erythema.  Eyes:     General:        Right eye: No discharge.        Left eye: No discharge.     Extraocular Movements: Extraocular movements intact.     Conjunctiva/sclera: Conjunctivae normal.  Cardiovascular:     Rate and Rhythm: Normal rate and regular rhythm.     Pulses: Normal pulses.     Heart sounds: Normal heart sounds.  Pulmonary:     Effort: Pulmonary effort is normal. No respiratory distress.     Breath sounds: Normal breath sounds. No wheezing or rales.  Abdominal:     General: Abdomen is flat. Bowel sounds are normal.     Palpations: Abdomen is soft.     Tenderness: There is no abdominal tenderness. There is no right CVA tenderness, left CVA tenderness or guarding.  Musculoskeletal:        General: Tenderness present. No swelling or deformity. Normal range of motion.     Cervical back:  Normal range of motion and neck supple. No rigidity or tenderness.     Right lower leg: No edema.     Left lower leg: No edema.     Comments: Full arom of ankle flexion/extension/inversion/eversion. Muscle strencth 5/5 bilaterally. Posterior tibial pulses full and intact bilaterally. No overlying skin abnormalities. TTP of just along ATFL. Pain elicited with ankle medial horizontal rotation and inversion. No TTP 5th metatarsal, posterior medial and lateral malleolus. Patient able to ambulate without difficulty. No sensor deficits distal to injury.   Skin:    General: Skin is warm and dry.     Capillary Refill: Capillary refill takes less than 2 seconds.  Neurological:     General: No focal deficit present.     Mental Status: She is alert and oriented to person, place, and time.  Psychiatric:        Mood and Affect: Mood normal.        Behavior: Behavior normal.    ED Results / Procedures / Treatments   Labs (all labs ordered are listed, but only abnormal results are displayed) Labs Reviewed - No data to display  EKG None  Radiology DG Ankle Complete Left  Result Date: 03/10/2022 CLINICAL DATA:  One month history of left ankle pain EXAM: LEFT ANKLE COMPLETE - 3+ VIEW COMPARISON:  None Available. FINDINGS: Frontal, oblique, and lateral views of the left ankle are obtained. No acute fracture, subluxation, or dislocation. Joint spaces are well preserved. Soft tissues are unremarkable. IMPRESSION: 1. Unremarkable left ankle. Electronically Signed   By: Randa Ngo M.D.   On: 03/10/2022 18:20    Procedures Procedures    Medications Ordered in ED Medications - No data to display  ED Course/ Medical Decision Making/ A&P                           Medical Decision Making Amount and/or Complexity of Data Reviewed Radiology: ordered.  Risk Prescription drug management.   This patient presents to the ED for concern of ankle pain, this involves an extensive number of treatment  options, and is a complaint that carries with it a high risk of complications and morbidity.  The differential diagnosis includes fx, strain/sprain, compartment syndrome, gout, septic arthritis, OA, dislocation   Co morbidities that complicate the patient evaluation  Htn, asthma   Lab Tests:  I Ordered, and personally interpreted labs.  The pertinent results include:  n/a   Imaging Studies ordered:  I ordered imaging studies including L ankle x-ray  I independently visualized and interpreted imaging which showed no acute abnormalities. I agree with the radiologist interpretation   Cardiac Monitoring: / EKG:  The patient was maintained on a cardiac monitor.  I personally viewed  and interpreted the cardiac monitored which showed an underlying rhythm of: sinus rhythm   Consultations Obtained:  N/a   Problem List / ED Course / Critical interventions / Medication management  Ankle pain Reevaluation of the patient showed that the patient stayed the same I have reviewed the patients home medicines and have made adjustments as needed   Social Determinants of Health:  Denies illicit drug use   Test / Admission - Considered:  Ankle pain Vitals signs significant for HTN with BP of 150/101. Patient advised to f/u with PCP regarding potential future management. Otherwise within normal range and stable throughout visit. imaging studies significant for: no acute abnormalities Ankle pain most likely secondary to ligamentous sprain of ATFL. Lace-up brace applied in the ED. Recommended heavily cushion shoes, rest, ice, elevate, compress affected extremitiy. Encouraged to make an appointment with ortho for 7-10 days to f/u if symptoms do not get better, ankle begins to "click/catch" joint feels unstable but can cancel if symptoms get better  Worrisome signs and symptoms were discussed with the patient, and the patient acknowledged understanding to return to the ED if noticed. Patient was  stable upon discharge.          Final Clinical Impression(s) / ED Diagnoses Final diagnoses:  Sprain of anterior talofibular ligament of left ankle, initial encounter    Rx / DC Orders ED Discharge Orders          Ordered    ibuprofen (ADVIL) 600 MG tablet  Every 6 hours PRN        03/10/22 1912              Wilnette Kales, PA 36/46/80 [id number]    Lianne Cure, DO 24/82/50 1837

## 2022-03-22 ENCOUNTER — Ambulatory Visit (INDEPENDENT_AMBULATORY_CARE_PROVIDER_SITE_OTHER): Payer: BC Managed Care – PPO | Admitting: Physician Assistant

## 2022-03-22 DIAGNOSIS — M25572 Pain in left ankle and joints of left foot: Secondary | ICD-10-CM

## 2022-03-22 NOTE — Progress Notes (Signed)
Office Visit Note   Patient: Katherine Wu           Date of Birth: 08-13-1986           MRN: [id number] Visit Date: 03/22/2022              Requested by: No referring provider defined for this encounter. PCP: Pcp, No   Assessment & Plan: Visit Diagnoses:  1. Pain in left ankle and joints of left foot     Plan: Impression is chronic left ankle pain to the ATFL although she denies any recent injury to really explain a sprain.  I feel as though she mainly needs rest from her physical job, but I would like to also place her in a cam walker for immobilization as the ASO seems to bother her.  She is agreeable to this plan.  We will provide her with a desk work only note for the next 2 weeks.  Follow-up with [place] at that point for recheck.  Call with concerns or questions.  Follow-Up Instructions: Return in about 2 weeks (around 04/05/2022).   Orders:  No orders of the defined types were placed in this encounter.  No orders of the defined types were placed in this encounter.     Procedures: No procedures performed   Clinical Data: No additional findings.   Subjective: Chief Complaint  Patient presents with   Left Ankle - Pain    HPI patient is a pleasant 36 year old female who comes in today with left ankle pain for the past 5 to 6 weeks.  She denies any injury or change in activity but does note she works a very physical job if she is walking on concrete floors 10 to 12 hours daily.  The pain she has been having is primarily across the anterior and lateral ankle.  Symptoms are worse with activity to include walking and at the end of the day.  She has been taking ibuprofen without significant relief.  She feels as though her left ankle swells at times which seems to cause what she is describing as poor circulation.  She has tried wearing an ASO brace which she feels is made her ankle pain worse.  Review of Systems as detailed in HPI.  All others reviewed and are  negative.   Objective: Vital Signs: LMP 03/02/2022   Physical Exam well-developed well-nourished female no acute distress.  Alert and oriented x3.  Ortho Exam left ankle exam shows no swelling.  No tenderness the medial or lateral [place].  No tenderness to the posterior tibial or peroneal tendons.  She does have moderate tenderness along the ATFL.  She has slight pain with inversion of the ankle.  Otherwise painless range of motion.  She is neurovascular intact distally.  Specialty Comments:  No specialty comments available.  Imaging: X-rays of the left ankle reviewed by me in canopy show no acute or structural abnormalities.   PMFS History: Patient Active Problem List   Diagnosis Date Noted   Encounter for fertility planning 11/25/2021   Essential hypertension 07/24/2021   Dysmenorrhea 11/19/2020   History of uterine fibroid 08/15/2020   Past Medical History:  Diagnosis Date   Asthma    Transient hypertension     No family history on file.  Past Surgical History:  Procedure Laterality Date   NO PAST SURGERIES     Social History   Occupational History   Not on file  Tobacco Use   Smoking status: Every Day  Types: Cigarettes    Last attempt to quit: 04/25/2016    Years since quitting: 5.9   Smokeless tobacco: Never  Vaping Use   Vaping Use: Never used  Substance and Sexual Activity   Alcohol use: Yes   Drug use: No   Sexual activity: Yes

## 2022-03-30 DIAGNOSIS — N760 Acute vaginitis: Secondary | ICD-10-CM | POA: Diagnosis not present

## 2022-03-30 DIAGNOSIS — N898 Other specified noninflammatory disorders of vagina: Secondary | ICD-10-CM | POA: Diagnosis not present

## 2022-03-30 DIAGNOSIS — Z319 Encounter for procreative management, unspecified: Secondary | ICD-10-CM | POA: Diagnosis not present

## 2022-03-30 DIAGNOSIS — D259 Leiomyoma of uterus, unspecified: Secondary | ICD-10-CM | POA: Diagnosis not present

## 2022-04-14 ENCOUNTER — Ambulatory Visit (INDEPENDENT_AMBULATORY_CARE_PROVIDER_SITE_OTHER): Payer: BC Managed Care – PPO | Admitting: Orthopaedic Surgery

## 2022-04-14 DIAGNOSIS — M25572 Pain in left ankle and joints of left foot: Secondary | ICD-10-CM | POA: Diagnosis not present

## 2022-04-14 NOTE — Progress Notes (Signed)
Office Visit Note   Patient: Katherine Wu           Date of Birth: 1985/12/22           MRN: [id number] Visit Date: 04/14/2022              Requested by: No referring provider defined for this encounter. PCP: Pcp, No   Assessment & Plan: Visit Diagnoses:  1. Pain in left ankle and joints of left foot     Plan: Impression is chronic left ankle pain primarily to the ATFL.  At this point, due to the chronicity of her symptoms we have offered her the choice of proceeding with diagnostic and potentially therapeutic cortisone injection versus going ahead and getting an MRI.  She would like to go ahead and get the MRI.  She will follow-up with [place] once this is been completed.  Continue wearing the cam walker for now.  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  Call with concerns or questions.  Follow-Up Instructions: Return for after MRI.   Orders:   No orders of the defined types were placed in this encounter.  No orders of the defined types were placed in this encounter.     Procedures: No procedures performed   Clinical Data: No additional findings.   Subjective: Chief Complaint  Patient presents with   Left Ankle - Pain, Follow-up    HPI patient is a pleasant 36 year old female who comes in today with chronic left ankle pain for the past 8 to 9 weeks.  She denies ever having an injury.  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  She was seen by [place] a few weeks ago where all of her pain was localized to the ATFL.  I placed her in a cam walker and sent in NSAIDs as well as provide her with a work note for desk work only for the last 3 weeks.  She denies any relief over the past few weeks.  She continues to endorse pain over the ATFL worse with bearing weight.  Previous x-rays were unremarkable.  Review of Systems as detailed in HPI.  All others reviewed and are negative.   Objective: Vital  Signs: There were no vitals taken for this visit.  Physical Exam well-developed well-nourished female no acute distress.  Alert and oriented x3.  Ortho Exam left ankle exam reveals no swelling.  No erythema.  Moderate tenderness ATFL.  No bony tenderness or tenderness along posterior tibial or peroneal tendons.  Slight pain with ankle dorsiflexion and inversion.  She is neurovascular intact distally.  Specialty Comments:  No specialty comments available.  Imaging: No new imaging   PMFS History: Patient Active Problem List   Diagnosis Date Noted   Encounter for fertility planning 11/25/2021   Essential hypertension 07/24/2021   Dysmenorrhea 11/19/2020   History of uterine fibroid 08/15/2020   Past Medical History:  Diagnosis Date   Asthma    Transient hypertension     No family history on file.  Past Surgical History:  Procedure Laterality Date   NO PAST SURGERIES     Social History   Occupational History   Not on file  Tobacco Use   Smoking status: Every Day    Types: Cigarettes    Last attempt to quit: 04/25/2016    Years since quitting: 5.9   Smokeless tobacco: Never  Vaping Use   Vaping Use: Never used  Substance and Sexual Activity   Alcohol use: Yes   Drug use: No   Sexual activity: Yes

## 2022-04-28 DIAGNOSIS — D252 Subserosal leiomyoma of uterus: Secondary | ICD-10-CM | POA: Diagnosis not present

## 2022-04-28 DIAGNOSIS — D251 Intramural leiomyoma of uterus: Secondary | ICD-10-CM | POA: Diagnosis not present

## 2022-05-28 DIAGNOSIS — M25572 Pain in left ankle and joints of left foot: Secondary | ICD-10-CM | POA: Diagnosis not present

## 2022-06-01 IMAGING — US US PELVIS COMPLETE WITH TRANSVAGINAL
1 series · 15 of 25 positions shown · non-contrast
Comparison: 08/28/2020

CLINICAL DATA: Pelvic pain, left greater than right



[Series 1: us pelvis complete with transvaginal · 101 acquisitions, 15 frames shown]
[im 1/101]
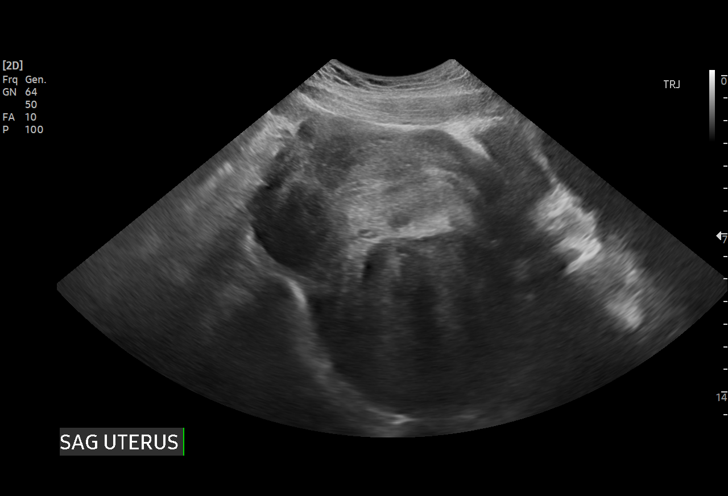
[im 9/101]
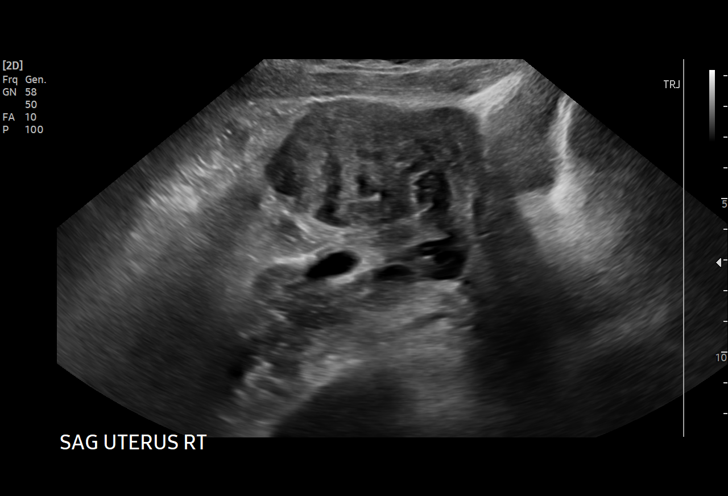
[im 17/101]
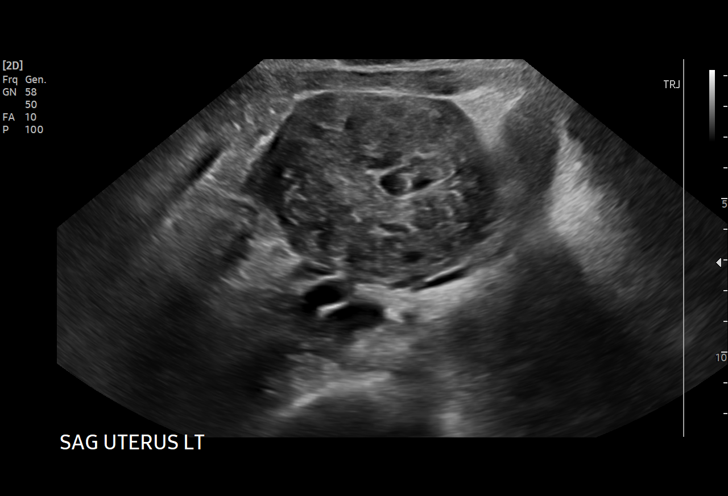
[im 21/101]
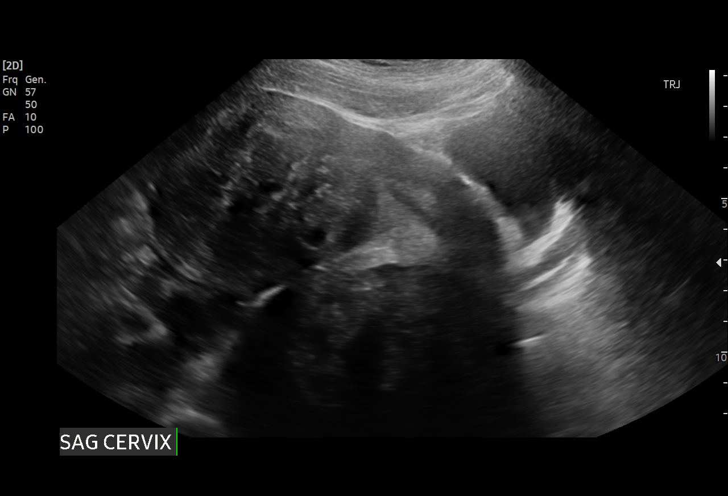
[im 30/101]
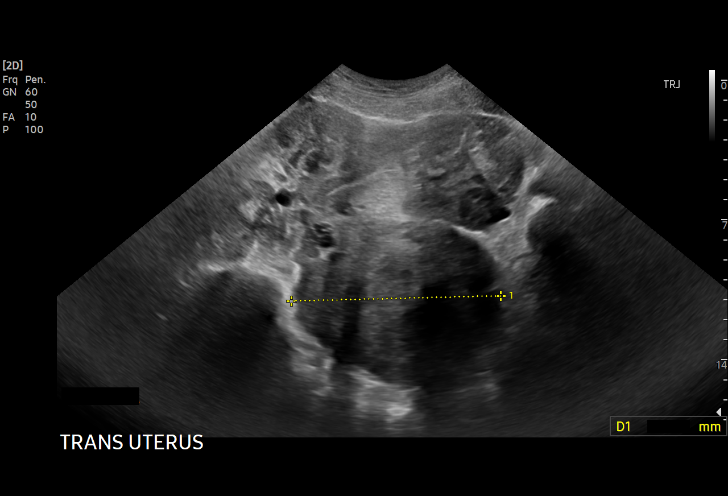
[im 38/101]
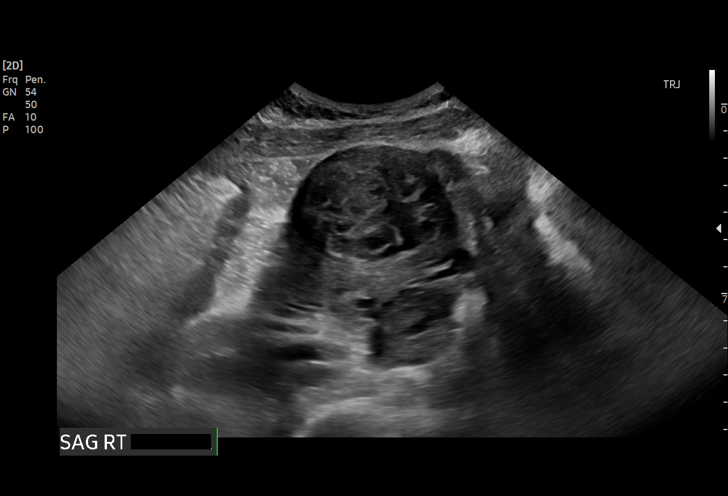
[im 42/101]
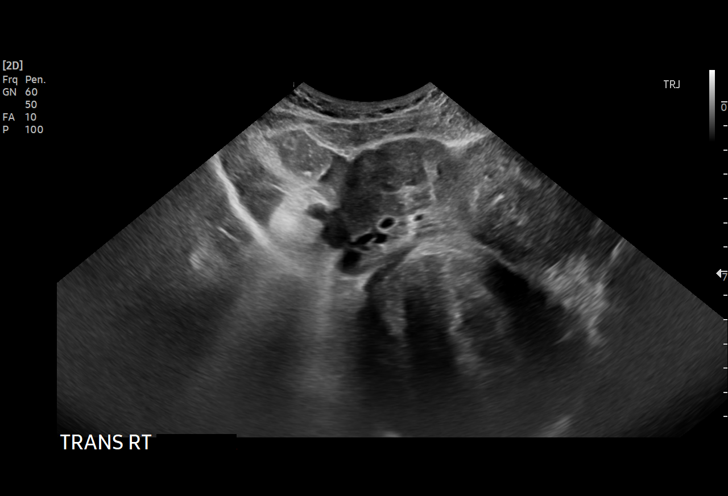
[im 51/101]
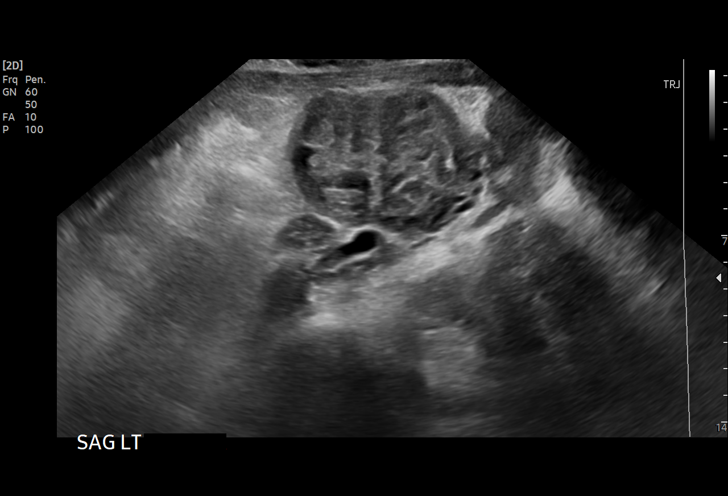
[im 59/101]
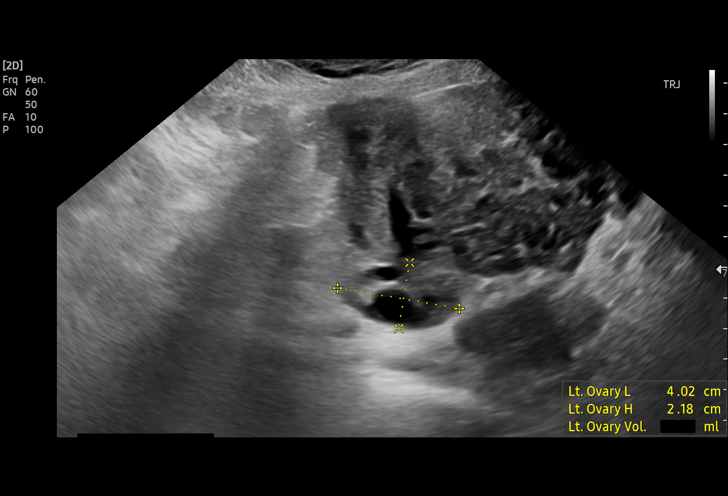
[im 63/101]
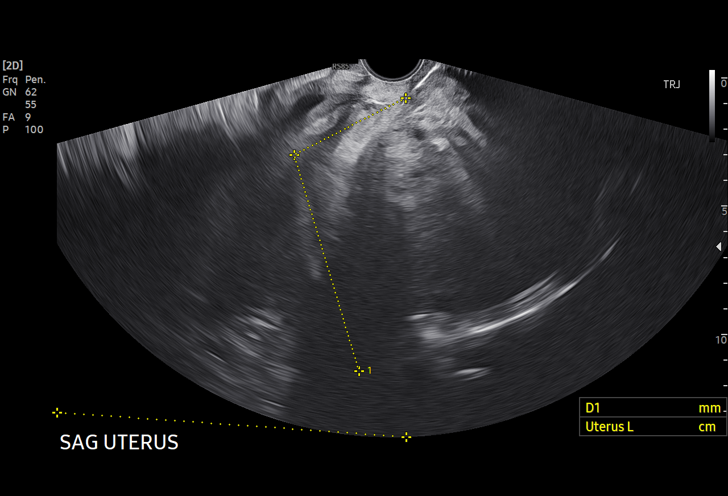
[im 71/101]
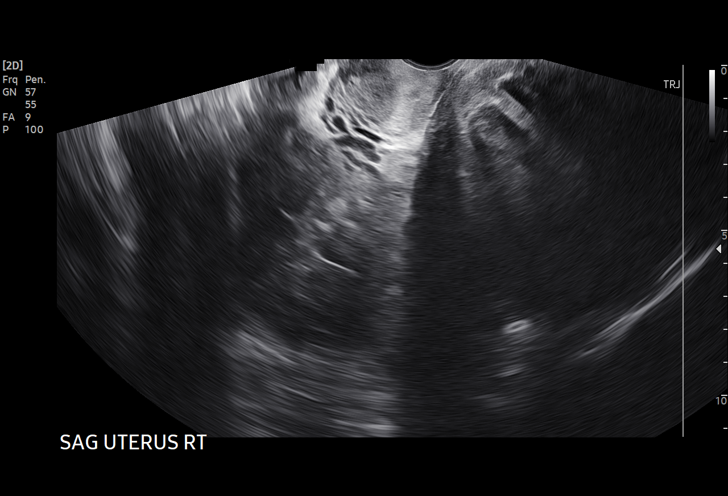
[im 80/101]
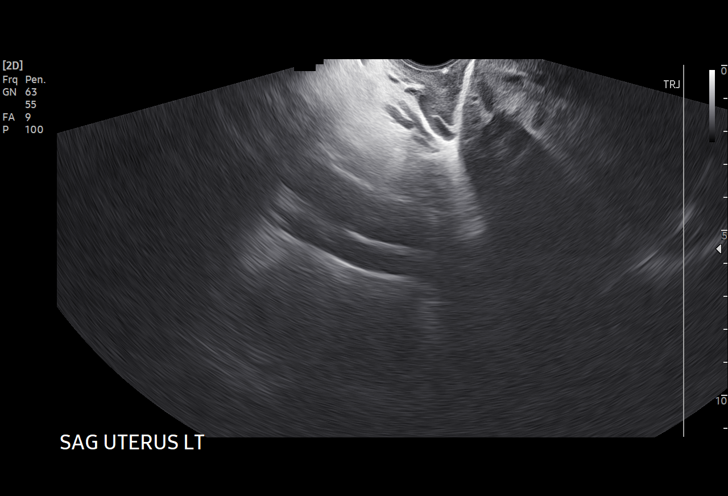
[im 84/101]
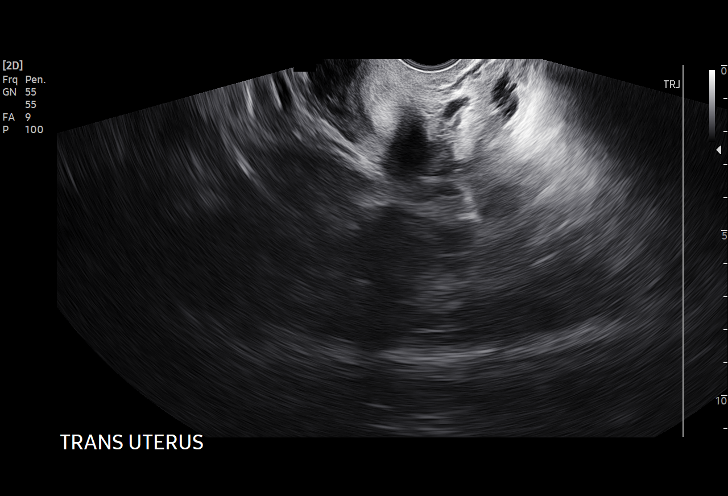
[im 92/101]
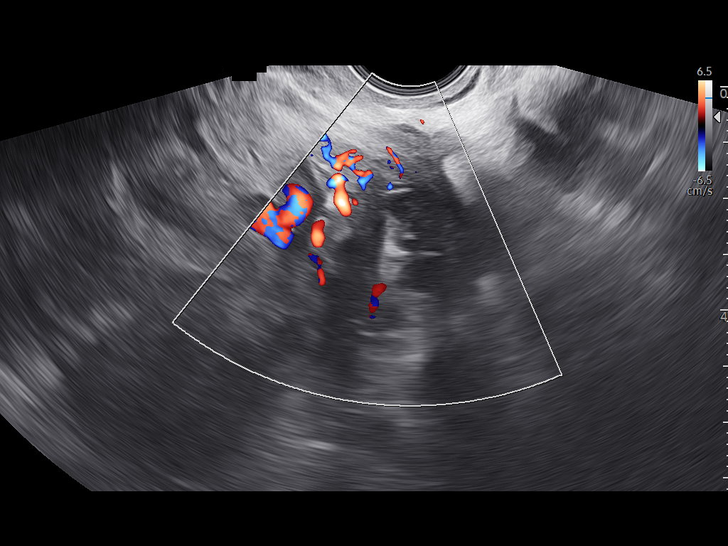
[im 101/101]
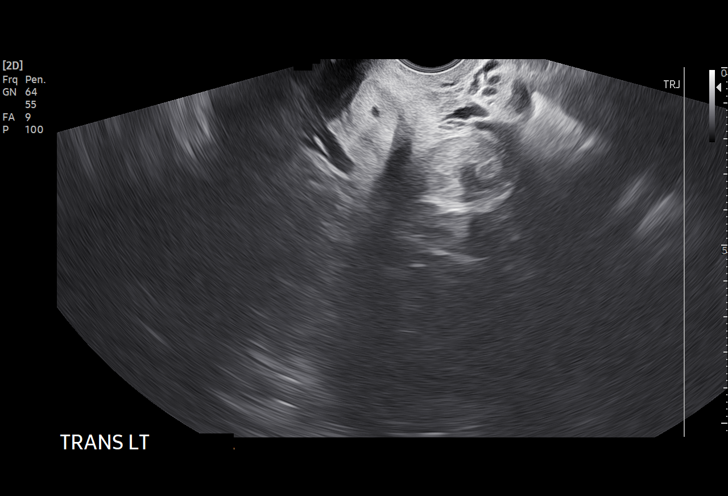

[15 of 25 positions shown; findings below may reference images not displayed]

FINDINGS: Uterus

Measurements: 11.7 x 6.2 x 11.2 cm = volume: 429 mL. Multiple
fibroids. Largest is posterior body fibroid measuring up to 10.4 cm.
Left anterior body fibroid measures 8.5 cm. Central body fibroid
measures 7.5 cm.

Endometrium

Thickness: 9 mm in thickness.  No focal abnormality visualized.

Right ovary

Measurements: 4.6 x 3.1 x 2.1 cm = volume: 15.2 mL. Normal
appearance/no adnexal mass.

Left ovary

Measurements: 4.0 x 2.2 x 3.2 cm = volume: 14.6 mL. Normal
appearance/no adnexal mass.

Other findings

No abnormal free fluid.
IMPRESSION: Enlarged fibroid uterus.

No acute findings.

## 2022-08-16 ENCOUNTER — Inpatient Hospital Stay: Admit: 2022-08-16 | Payer: BC Managed Care – PPO | Admitting: Obstetrics and Gynecology

## 2022-08-16 SURGERY — MYOMECTOMY, ABDOMINAL APPROACH
Anesthesia: General

## 2022-10-08 ENCOUNTER — Ambulatory Visit: Payer: BC Managed Care – PPO | Admitting: Nurse Practitioner

## 2022-10-15 ENCOUNTER — Emergency Department (HOSPITAL_BASED_OUTPATIENT_CLINIC_OR_DEPARTMENT_OTHER)
Admission: EM | Admit: 2022-10-15 | Discharge: 2022-10-15 | Disposition: A | Discharge status: Home / Self Care | Payer: BC Managed Care – PPO | Attending: Emergency Medicine | Admitting: Emergency Medicine

## 2022-10-15 ENCOUNTER — Other Ambulatory Visit: Payer: Self-pay

## 2022-10-15 ENCOUNTER — Other Ambulatory Visit (HOSPITAL_BASED_OUTPATIENT_CLINIC_OR_DEPARTMENT_OTHER): Payer: Self-pay

## 2022-10-15 ENCOUNTER — Encounter (HOSPITAL_BASED_OUTPATIENT_CLINIC_OR_DEPARTMENT_OTHER): Payer: Self-pay | Admitting: Emergency Medicine

## 2022-10-15 ENCOUNTER — Emergency Department (HOSPITAL_BASED_OUTPATIENT_CLINIC_OR_DEPARTMENT_OTHER): Payer: BC Managed Care – PPO

## 2022-10-15 DIAGNOSIS — Z79899 Other long term (current) drug therapy: Secondary | ICD-10-CM | POA: Insufficient documentation

## 2022-10-15 DIAGNOSIS — R059 Cough, unspecified: Secondary | ICD-10-CM | POA: Insufficient documentation

## 2022-10-15 DIAGNOSIS — Z1152 Encounter for screening for COVID-19: Secondary | ICD-10-CM | POA: Diagnosis not present

## 2022-10-15 DIAGNOSIS — R051 Acute cough: Secondary | ICD-10-CM

## 2022-10-15 DIAGNOSIS — J45909 Unspecified asthma, uncomplicated: Secondary | ICD-10-CM | POA: Insufficient documentation

## 2022-10-15 DIAGNOSIS — J45901 Unspecified asthma with (acute) exacerbation: Secondary | ICD-10-CM

## 2022-10-15 DIAGNOSIS — I10 Essential (primary) hypertension: Secondary | ICD-10-CM | POA: Diagnosis not present

## 2022-10-15 LAB — RESP PANEL BY RT-PCR (RSV, FLU A&B, COVID)  RVPGX2
Influenza A by PCR: NEGATIVE
Influenza B by PCR: NEGATIVE
Resp Syncytial Virus by PCR: NEGATIVE
SARS Coronavirus 2 by RT PCR: NEGATIVE

## 2022-10-15 MED ORDER — ALBUTEROL SULFATE HFA 108 (90 BASE) MCG/ACT IN AERS
2.0000 | INHALATION_SPRAY | RESPIRATORY_TRACT | 0 refills | Status: DC | PRN
  Filled 2022-10-15: qty 18, 17d supply, fill #0

## 2022-10-15 NOTE — Discharge Instructions (Addendum)
Please take your medications as prescribed. I recommend close follow-up with PCP for reevaluation.  Please do not hesitate to return to emergency department if worrisome signs symptoms we discussed become apparent.  

## 2022-10-15 NOTE — ED Notes (Signed)
Pt discharged to home. Discharge instructions have been discussed with patient and/or family members. Pt verbally acknowledges understanding d/c instructions, and endorses comprehension to checkout at registration before leaving.  °

## 2022-10-15 NOTE — ED Provider Notes (Signed)
[facility] Provider Note   CSN: [id number] Arrival date & time: 10/15/22  1034     History  Chief Complaint  Patient presents with   Cough    Katherine Wu is a 37 y.o. female with a past medical history of asthma presenting to the emergency department for evaluation of cough.  Patient reports that she has has worsening cough in the last month.  Patient reports that she has some yellowish mucus which is different from the clear mucus she has from asthma.  No fever, headache, runny nose, chest pain, shortness of breath.  Patient reports that she has not used her inhaler and nebulizer for 2 months.   Cough   Past Medical History:  Diagnosis Date   Asthma    Transient hypertension    Past Surgical History:  Procedure Laterality Date   NO PAST SURGERIES       Home Medications Prior to Admission medications   Medication Sig Start Date End Date Taking? Authorizing Provider  albuterol (VENTOLIN HFA) 108 (90 Base) MCG/ACT inhaler Inhale 2 puffs into the lungs every 4 (four) hours as needed for wheezing or shortness of breath. 10/15/22  Yes Rex Kras, PA  albuterol (VENTOLIN HFA) 108 (90 Base) MCG/ACT inhaler Inhale 1-2 puffs into the lungs every 6 (six) hours as needed for wheezing or shortness of breath. 10/15/21   Hazel Sams, PA-C  amLODipine (NORVASC) 5 MG tablet Take 1 tablet (5 mg total) by mouth daily. 11/25/21   Clarnce Flock, MD  ibuprofen (ADVIL) 600 MG tablet Take 1 tablet (600 mg total) by mouth every 6 (six) hours as needed. 03/10/22   Wilnette Kales, PA      Allergies    Patient has no known allergies.    Review of Systems   Review of Systems  Respiratory:  Positive for cough.     Physical Exam Updated Vital Signs BP (!) 153/94   Pulse (!) 18   Temp 98.2 F (36.8 C)   Resp 18   Ht '5\' 11"'$  (1.803 m)   Wt 97.5 kg   LMP 10/01/2022   SpO2 100%   BMI 29.99 kg/m  Physical Exam Vitals and nursing note reviewed.   Constitutional:      Appearance: Normal appearance.  HENT:     Head: Normocephalic and atraumatic.     Mouth/Throat:     Mouth: Mucous membranes are moist.  Eyes:     General: No scleral icterus. Cardiovascular:     Rate and Rhythm: Normal rate and regular rhythm.     Pulses: Normal pulses.     Heart sounds: Normal heart sounds.  Pulmonary:     Effort: Pulmonary effort is normal.     Breath sounds: Normal breath sounds.  Abdominal:     General: Abdomen is flat.     Palpations: Abdomen is soft.     Tenderness: There is no abdominal tenderness.  Musculoskeletal:        General: No deformity.  Skin:    General: Skin is warm.     Findings: No rash.  Neurological:     General: No focal deficit present.     Mental Status: She is alert.  Psychiatric:        Mood and Affect: Mood normal.     ED Results / Procedures / Treatments   Labs (all labs ordered are listed, but only abnormal results are displayed) Labs Reviewed  RESP PANEL BY RT-PCR (RSV,  FLU A&B, COVID)  [id number]    EKG None  Radiology DG Chest 2 View  Result Date: 10/15/2022 CLINICAL DATA:  Cough and congestion EXAM: CHEST - 2 VIEW COMPARISON:  None Available. FINDINGS: The heart size and mediastinal contours are within normal limits. Both lungs are clear. The visualized skeletal structures are unremarkable. IMPRESSION: No active cardiopulmonary disease. Electronically Signed   By: Jill Side M.D.   On: 10/15/2022 11:51    Procedures Procedures    Medications Ordered in ED Medications - No data to display  ED Course/ Medical Decision Making/ A&P                           Medical Decision Making Amount and/or Complexity of Data Reviewed Radiology: ordered.  Risk Prescription drug management.   This patient presents to the ED for cough, this involves an extensive number of treatment options, and is a complaint that carries with a high risk of complications and morbidity.  The differential diagnosis  includes allergic rhinitis, sinusitis, URI, asthma, COPD, pneumonia, tuberculosis, ACE inhibitor, GERD. This is not an exhaustive list.  Lab tests: Viral panel negative.  Imaging studies: I ordered imaging studies. I personally reviewed, interpreted imaging and agree with the radiologist's interpretations. The results include: Chest x-ray negative.  Problem list/ ED course/ Critical interventions/ Medical management: HPI: See above Vital signs within normal range and stable throughout visit. Laboratory/imaging studies significant for: See above. On physical examination, patient is afebrile and appears in no acute distress. This patient presents with chronic cough, most consistent with asthma exacerbation. Presentation not consistent with acute bacterial pneumonia, influenza, asthma, transient airway hyperresponsiveness. Presentation not consistent with other chronic causes of cough including GERD, postnasal discharge, medication side effect, CHF, lung cancer or mass.  Based on patient's clinical presentations and laboratory/imaging studies I suspect asthma exacerbation.  I sent an Rx of albuterol inhaler.  Advised patient to follow-up with primary care physician for further evaluation and management.  Return to the ER if new or worsening symptoms. I have reviewed the patient home medicines and have made adjustments as needed.  Cardiac monitoring/EKG: The patient was maintained on a cardiac monitor.  I personally reviewed and interpreted the cardiac monitor which showed an underlying rhythm of: sinus rhythm.  Additional history obtained: External records from outside source obtained and reviewed including: Chart review including previous notes, labs, imaging.  Consultations obtained:  Disposition Continued outpatient therapy. Follow-up with PCP recommended for reevaluation of symptoms. Treatment plan discussed with patient.  Pt acknowledged understanding was agreeable to the plan. Worrisome  signs and symptoms were discussed with patient, and patient acknowledged understanding to return to the ED if they noticed these signs and symptoms. Patient was stable upon discharge.   This chart was dictated using voice recognition software.  Despite best efforts to proofread,  errors can occur which can change the documentation meaning.          Final Clinical Impression(s) / ED Diagnoses Final diagnoses:  Acute cough  Moderate asthma with exacerbation, unspecified whether persistent    Rx / DC Orders ED Discharge Orders          Ordered    albuterol (VENTOLIN HFA) 108 (90 Base) MCG/ACT inhaler  Every 4 hours PRN        10/15/22 1550              Rex Kras, [place] 10/15/22 1839    Jeanell Sparrow, DO  10/18/22 2035  

## 2022-10-15 NOTE — ED Notes (Addendum)
RT assessed in triage. Hx of asthma. No wheezing noted. BBS clear

## 2022-10-15 NOTE — ED Triage Notes (Signed)
Pt arrives pov, steady gait, c/o cough, congestion and increased mucus x 1 month. Reports not having inhaler or neb

## 2022-10-15 NOTE — ED Notes (Signed)
Pt. Reports she has been out of her inhaler for 2 mths.  Pt. Reports congestion and feels at time she can't get a deep breat.  Upon auscultation clear lung snds through out.  Pt  in no distress.

## 2022-12-03 ENCOUNTER — Encounter: Payer: Self-pay | Admitting: Nurse Practitioner

## 2022-12-03 ENCOUNTER — Other Ambulatory Visit: Payer: BC Managed Care – PPO

## 2022-12-03 ENCOUNTER — Ambulatory Visit (INDEPENDENT_AMBULATORY_CARE_PROVIDER_SITE_OTHER): Payer: BC Managed Care – PPO | Admitting: Nurse Practitioner

## 2022-12-03 VITALS — BP 116/80 | HR 83 | Temp 97.7°F | Ht 70.75 in | Wt 213.2 lb

## 2022-12-03 DIAGNOSIS — Z13220 Encounter for screening for lipoid disorders: Secondary | ICD-10-CM

## 2022-12-03 DIAGNOSIS — M545 Low back pain, unspecified: Secondary | ICD-10-CM | POA: Diagnosis not present

## 2022-12-03 DIAGNOSIS — Z1159 Encounter for screening for other viral diseases: Secondary | ICD-10-CM | POA: Diagnosis not present

## 2022-12-03 DIAGNOSIS — Z131 Encounter for screening for diabetes mellitus: Secondary | ICD-10-CM

## 2022-12-03 DIAGNOSIS — R0683 Snoring: Secondary | ICD-10-CM | POA: Diagnosis not present

## 2022-12-03 DIAGNOSIS — Z136 Encounter for screening for cardiovascular disorders: Secondary | ICD-10-CM

## 2022-12-03 DIAGNOSIS — R5383 Other fatigue: Secondary | ICD-10-CM

## 2022-12-03 DIAGNOSIS — R0681 Apnea, not elsewhere classified: Secondary | ICD-10-CM | POA: Diagnosis not present

## 2022-12-03 LAB — COMPREHENSIVE METABOLIC PANEL
ALT: 11 U/L (ref 0–35)
AST: 13 U/L (ref 0–37)
Albumin: 4.3 g/dL (ref 3.5–5.2)
Alkaline Phosphatase: 59 U/L (ref 39–117)
BUN: 5 mg/dL — ABNORMAL LOW (ref 6–23)
CO2: 23 mEq/L (ref 19–32)
Calcium: 9.2 mg/dL (ref 8.4–10.5)
Chloride: 103 mEq/L (ref 96–112)
Creatinine, Ser: 0.81 mg/dL (ref 0.40–1.20)
GFR: 93.12 mL/min (ref >60.00)
Glucose, Bld: 105 mg/dL — ABNORMAL HIGH (ref 70–99)
Potassium: 3.2 mEq/L — ABNORMAL LOW (ref 3.5–5.1)
Sodium: 135 mEq/L (ref 135–145)
Total Bilirubin: 0.4 mg/dL (ref 0.2–1.2)
Total Protein: 7.2 g/dL (ref 6.0–8.3)

## 2022-12-03 LAB — URINALYSIS WITH CULTURE, IF INDICATED
Bilirubin Urine: NEGATIVE
Hgb urine dipstick: NEGATIVE
Ketones, ur: NEGATIVE
Leukocytes,Ua: NEGATIVE
Nitrite: NEGATIVE
Specific Gravity, Urine: 1.025 (ref 1.000–1.030)
Urine Glucose: NEGATIVE
Urobilinogen, UA: 0.2 (ref 0.0–1.0)
pH: 6 (ref 5.0–8.0)

## 2022-12-03 LAB — CBC
HCT: 40.6 % (ref 36.0–46.0)
Hemoglobin: 13.4 g/dL (ref 12.0–15.0)
MCHC: 33 g/dL (ref 30.0–36.0)
MCV: 90 fl (ref 78.0–100.0)
Platelets: 301 10*3/uL (ref 150.0–400.0)
RBC: 4.51 Mil/uL (ref 3.87–5.11)
RDW: 13.3 % (ref 11.5–15.5)
WBC: 3.2 10*3/uL — ABNORMAL LOW (ref 4.0–10.5)

## 2022-12-03 LAB — TSH: TSH: 0.78 u[IU]/mL (ref 0.35–5.50)

## 2022-12-03 LAB — HEMOGLOBIN A1C: Hgb A1c MFr Bld: 5.4 % (ref 4.6–6.5)

## 2022-12-03 LAB — LIPID PANEL
Cholesterol: 136 mg/dL (ref 0–200)
HDL: 42.7 mg/dL (ref >39.00)
LDL Cholesterol: 69 mg/dL (ref 0–99)
NonHDL: 93.02
Total CHOL/HDL Ratio: 3
Triglycerides: 120 mg/dL (ref 0.0–149.0)
VLDL: 24 mg/dL (ref 0.0–40.0)

## 2022-12-03 NOTE — Patient Instructions (Signed)
Ibuprofen do not exceed 3200 mg/day Tylenol (acetaminophen) do not exceed 3000 mg/day

## 2022-12-03 NOTE — Progress Notes (Signed)
New Patient Office Visit  Subjective    Patient ID: Katherine Wu, female    DOB: May 30, 1986  Age: 37 y.o. MRN: [id number]  CC:  Chief Complaint  Patient presents with   Back Pain    HPI Katherine Wu presents to establish care Main concern today is right sided pain and low back pain.  Has been going on for couple of months, seems to be improving slightly.  No traumatic event preceding pain, no personal history of malignancy, no personal history of osteoporosis.  No new bowel bladder incontinence, no dysuria, no hematuria, no weakness in legs, intermittent pins-and-needles that occur in legs and arms, no recent falls.  Has tried Tylenol as well as Excedrin which has resulted in mild to moderate relief in pain.  Works in a [employer] where she has a strenuous job, has become less strenuous recently however.  Described as a soreness 8/10 in intensity.  Also has fatigue, tells me she has been told she snores has been told she has had witnessed apneic episodes before, reports frequent nocturia, does report daytime fatigue requires naps often on days off of work.  Outpatient Encounter Medications as of 12/03/2022  Medication Sig   albuterol (VENTOLIN HFA) 108 (90 Base) MCG/ACT inhaler Inhale 1-2 puffs into the lungs every 6 (six) hours as needed for wheezing or shortness of breath.   albuterol (VENTOLIN HFA) 108 (90 Base) MCG/ACT inhaler Inhale 2 puffs into the lungs every 4 (four) hours as needed for wheezing or shortness of breath.   ibuprofen (ADVIL) 600 MG tablet Take 1 tablet (600 mg total) by mouth every 6 (six) hours as needed.   amLODipine (NORVASC) 5 MG tablet Take 1 tablet (5 mg total) by mouth daily. (Patient not taking: Reported on 12/03/2022)   No facility-administered encounter medications on file as of 12/03/2022.    Past Medical History:  Diagnosis Date   Asthma    Transient hypertension     Past Surgical History:  Procedure Laterality Date   NO PAST SURGERIES      No  family history on file.  Social History   Socioeconomic History   Marital status: Single    Spouse name: Not on file   Number of children: Not on file   Years of education: Not on file   Highest education level: Not on file  Occupational History   Not on file  Tobacco Use   Smoking status: Every Day    Types: Cigarettes    Last attempt to quit: 04/25/2016    Years since quitting: 6.6   Smokeless tobacco: Never  Vaping Use   Vaping Use: Never used  Substance and Sexual Activity   Alcohol use: Yes    Comment: social   Drug use: No   Sexual activity: Yes  Other Topics Concern   Not on file  Social History Narrative   Not on file   Social Determinants of Health   Financial Resource Strain: Not on file  Food Insecurity: No Food Insecurity (07/30/2021)   Hunger Vital Sign    Worried About Running Out of Food in the Last Year: Never true    Ran Out of Food in the Last Year: Never true  Transportation Needs: No Transportation Needs (07/30/2021)   PRAPARE - Hydrologist (Medical): No    Lack of Transportation (Non-Medical): No  Physical Activity: Not on file  Stress: Not on file  Social Connections: Not on file  Intimate  Partner Violence: Not on file    Review of Systems  Constitutional:  Positive for malaise/fatigue. Negative for weight loss.  Respiratory:  Negative for cough, shortness of breath and wheezing.   Cardiovascular:  Negative for chest pain.  Musculoskeletal:  Positive for back pain.        Objective    BP 116/80   Pulse 83   Temp 97.7 F (36.5 C) (Temporal)   Ht 5' 10.75" (1.797 m)   Wt 213 lb 4 oz (96.7 kg)   LMP 10/22/2022   SpO2 98%   BMI 29.95 kg/m   Physical Exam Vitals reviewed.  Constitutional:      General: She is not in acute distress.    Appearance: Normal appearance.  HENT:     Head: Normocephalic and atraumatic.  Neck:     Vascular: No carotid bruit.  Cardiovascular:     Rate and Rhythm: Normal  rate and regular rhythm.     Pulses: Normal pulses.     Heart sounds: Normal heart sounds.  Pulmonary:     Effort: Pulmonary effort is normal.     Breath sounds: Normal breath sounds.  Musculoskeletal:     Cervical back: Normal.     Thoracic back: Normal.     Lumbar back: Normal.       Back:     Comments: Mild tenderness to touch as represented by red line in picture above  Skin:    General: Skin is warm and dry.  Neurological:     General: No focal deficit present.     Mental Status: She is alert and oriented to person, place, and time.  Psychiatric:        Mood and Affect: Mood normal.        Behavior: Behavior normal.        Judgment: Judgment normal.         Assessment & Plan:   Problem List Items Addressed This Visit       Other   Snoring - Primary    Concern for possible sleep apnea, referral to neurology made today.      Relevant Orders   Ambulatory referral to Neurology   Witnessed episode of apnea    Concern for possible sleep apnea, referral to neurology made today.      Relevant Orders   Ambulatory referral to Neurology   Right-sided low back pain without sciatica    Etiology unclear, refer to physical therapy, treat with as needed ibuprofen and/or Tylenol.  Check labs today for further evaluation.  May consider trialing cyclobenzaprine pending lab results.      Relevant Orders   Urinalysis with Culture, if indicated   Ambulatory referral to Physical Therapy   Fatigue    Concern for possible sleep apnea will refer to neurology for evaluation. Labs ordered today, further recommendations may be made based upon these results.      Relevant Orders   TSH   Hemoglobin A1c   Lipid panel   Comprehensive metabolic panel   CBC   Encounter for lipid screening for cardiovascular disease    Labs ordered today, further recommendations may be made based upon these results.      Relevant Orders   TSH   Hemoglobin A1c   Lipid panel   Comprehensive  metabolic panel   CBC   Diabetes mellitus screening    Labs ordered today, further recommendations may be made based upon these results.      Relevant Orders  TSH   Hemoglobin A1c   Lipid panel   Comprehensive metabolic panel   CBC   Encounter for hepatitis C screening test for low risk patient    Labs ordered today, further recommendations may be made based upon these results.      Relevant Orders   Hepatitis C antibody    Return in about 2 weeks (around 12/17/2022) for 2 weeks with Judson Roch.   Ailene Ards, NP

## 2022-12-03 NOTE — Assessment & Plan Note (Signed)
Labs ordered today, further recommendations may be made based upon these results.

## 2022-12-03 NOTE — Assessment & Plan Note (Signed)
Concern for possible sleep apnea, referral to neurology made today.

## 2022-12-03 NOTE — Assessment & Plan Note (Signed)
Concern for possible sleep apnea will refer to neurology for evaluation. Labs ordered today, further recommendations may be made based upon these results.

## 2022-12-03 NOTE — Assessment & Plan Note (Signed)
Etiology unclear, refer to physical therapy, treat with as needed ibuprofen and/or Tylenol.  Check labs today for further evaluation.  May consider trialing cyclobenzaprine pending lab results.

## 2022-12-04 LAB — URINE CULTURE
MICRO NUMBER:: 14607106
Result:: NO GROWTH
SPECIMEN QUALITY:: ADEQUATE

## 2022-12-06 ENCOUNTER — Other Ambulatory Visit: Payer: Self-pay | Admitting: Nurse Practitioner

## 2022-12-06 DIAGNOSIS — E876 Hypokalemia: Secondary | ICD-10-CM

## 2022-12-06 LAB — HEPATITIS C ANTIBODY: Hepatitis C Ab: NONREACTIVE

## 2022-12-06 MED ORDER — POTASSIUM CHLORIDE CRYS ER 10 MEQ PO TBCR: 10.0000 meq | EXTENDED_RELEASE_TABLET | Freq: Every day | ORAL | 1 refills | Status: DC

## 2022-12-09 ENCOUNTER — Telehealth: Payer: Self-pay | Admitting: Nurse Practitioner

## 2022-12-09 DIAGNOSIS — E876 Hypokalemia: Secondary | ICD-10-CM

## 2022-12-09 NOTE — Telephone Encounter (Signed)
Left voicemail for pt to call back.

## 2022-12-09 NOTE — Telephone Encounter (Signed)
Please call patient and let her know that I discussed her low potassium level on her labs with my supervising physician and she recommended having Ms. Persinger come back to the office to repeat the lab before starting on potassium supplement as it may correct itself without needing a supplement. Thus, please schedule patient for repeat lab sometime this week or next week if possible. Lab orders are already active in the system. If patient has already started the potassium supplement she can stop it until potassium is redrawn. Please let me know if she has any questions.

## 2022-12-13 NOTE — Telephone Encounter (Signed)
Left detail message for pt in regards to labs, to call back

## 2022-12-13 NOTE — Telephone Encounter (Signed)
Made pt aware of lab results and to come another lab for low potassium level to be recheck. Pt stated she will do that

## 2022-12-26 ENCOUNTER — Other Ambulatory Visit: Payer: Self-pay | Admitting: Nurse Practitioner

## 2022-12-26 DIAGNOSIS — E876 Hypokalemia: Secondary | ICD-10-CM

## 2023-01-11 IMAGING — DX DG ANKLE COMPLETE 3+V*L*
3 series · 3 of 3 positions shown · non-contrast
Comparison: None Available.

CLINICAL DATA: One month history of left ankle pain

EXAM:
LEFT ANKLE COMPLETE - 3+ VIEW

[ankle ap]
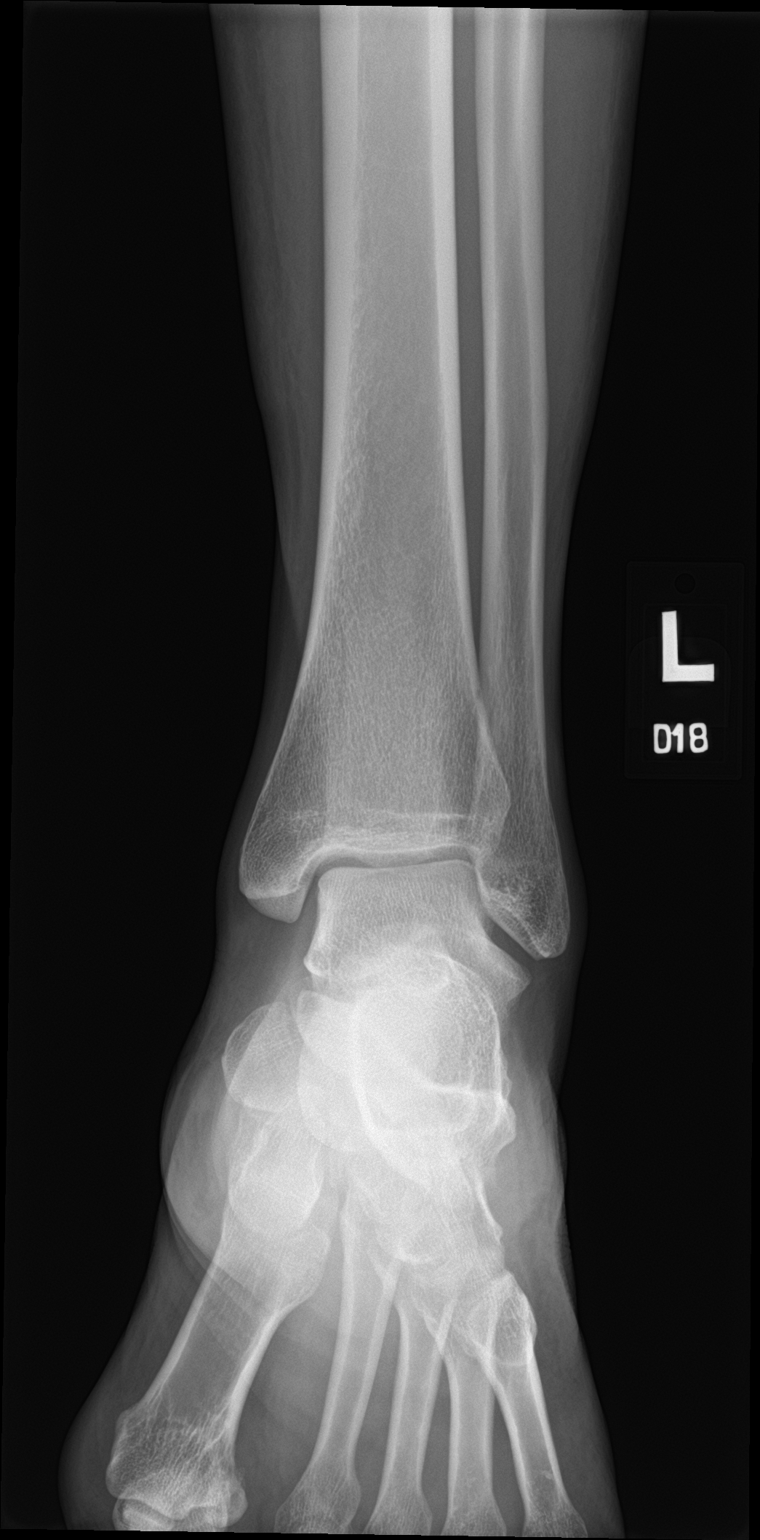

[ankle obl]
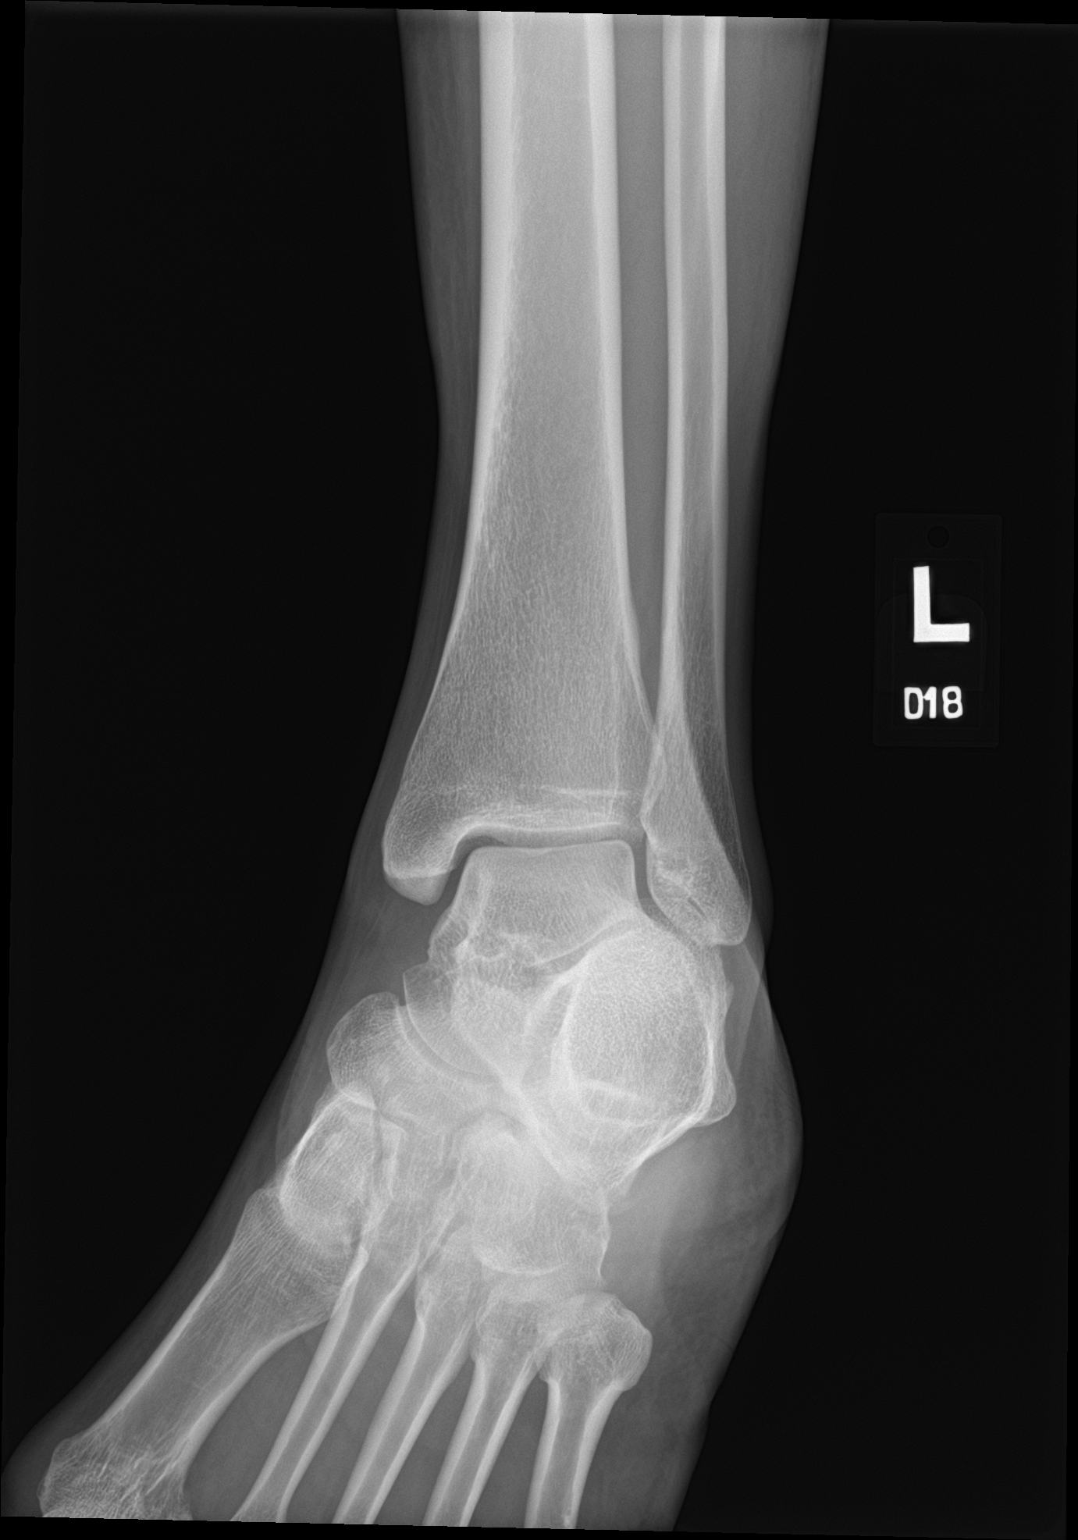

[ankle lat]
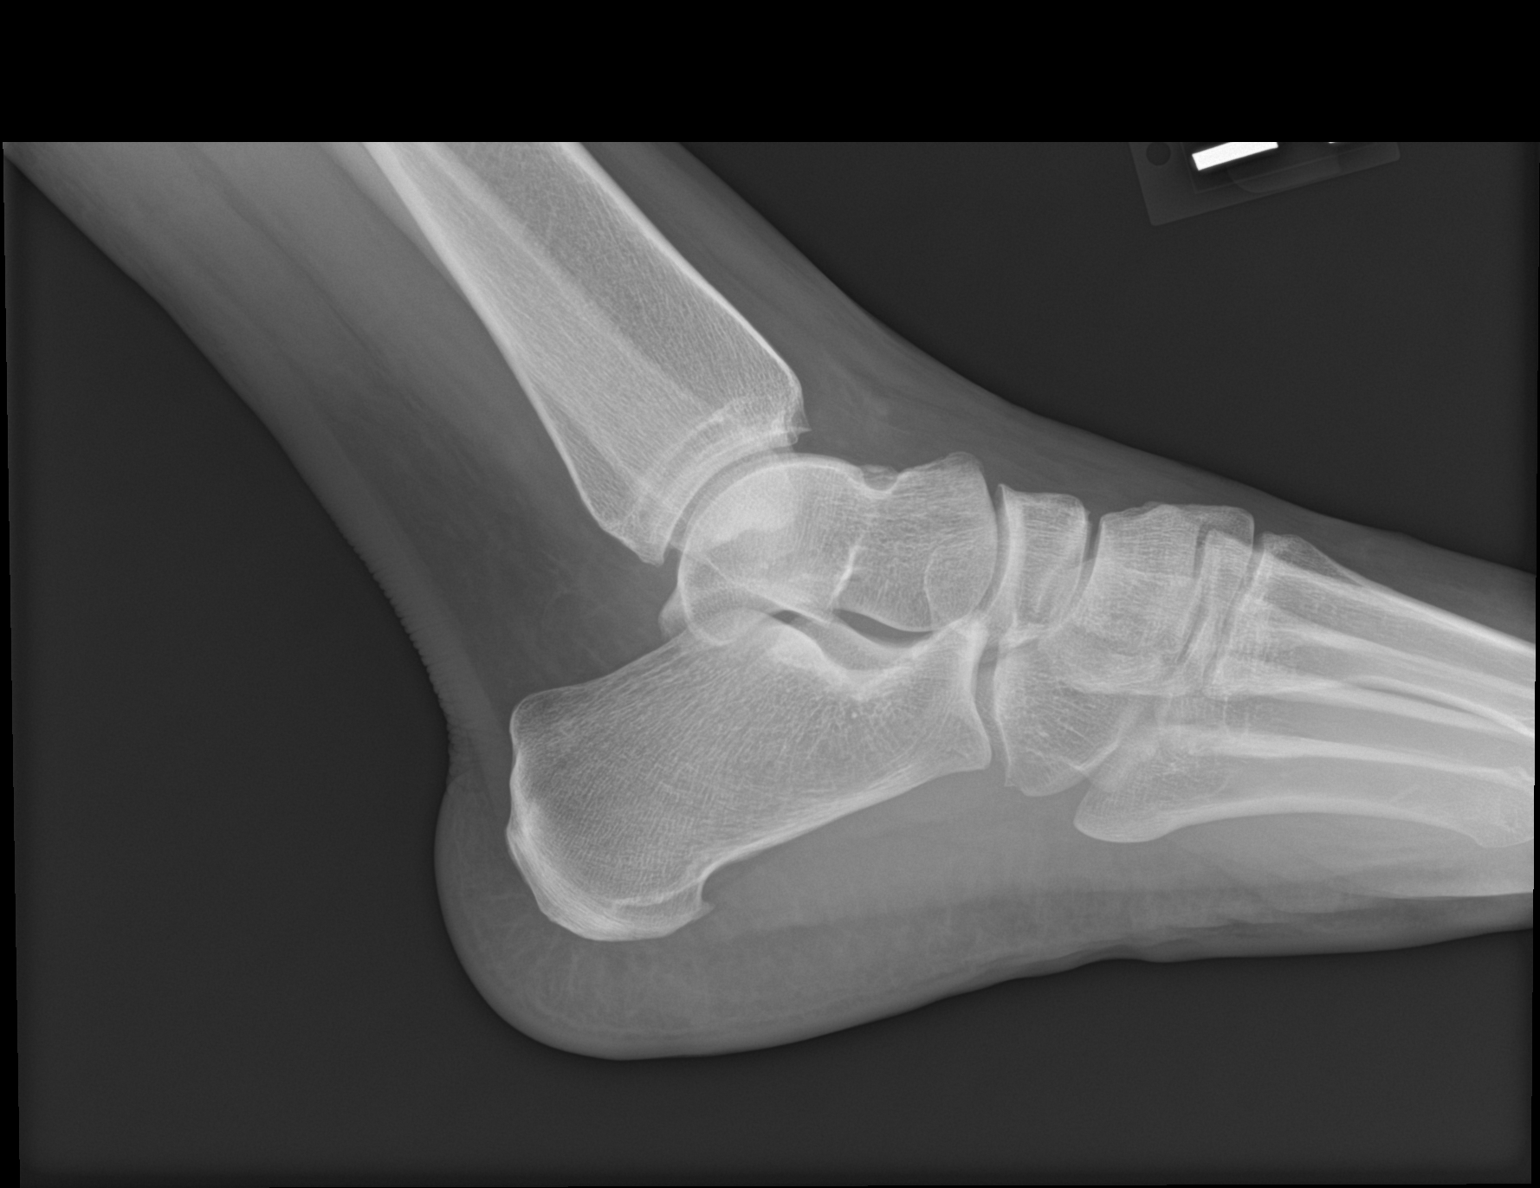

[3 of 3 positions shown; findings below may reference images not displayed]

FINDINGS: Frontal, oblique, and lateral views of the left ankle are obtained.
No acute fracture, subluxation, or dislocation. Joint spaces are
well preserved. Soft tissues are unremarkable.
IMPRESSION: 1. Unremarkable left ankle.

## 2023-08-15 ENCOUNTER — Telehealth: Payer: Self-pay | Admitting: Nurse Practitioner

## 2023-08-15 MED ORDER — ALBUTEROL SULFATE HFA 108 (90 BASE) MCG/ACT IN AERS: 1.0000 | INHALATION_SPRAY | Freq: Four times a day (QID) | RESPIRATORY_TRACT | 0 refills | Status: AC | PRN

## 2023-08-15 NOTE — Telephone Encounter (Signed)
Medication send to pt prefer pharmacy

## 2023-08-15 NOTE — Telephone Encounter (Signed)
Prescription Request  08/15/2023  LOV: 12/03/2022  What is the name of the medication or equipment? albuterol (VENTOLIN HFA) 108 (90 Base) MCG/ACT inhaler   Have you contacted your pharmacy to request a refill? No   Which pharmacy would you like this sent to?      [id number] [place], [place] - [address] [place] [address] Phone: [phone] Fax: [phone]  Patient notified that their request is being sent to the clinical staff for review and that they should receive a response within 2 business days.   Please advise at Mobile [phone] (mobile)

## 2024-03-08 ENCOUNTER — Ambulatory Visit: Admitting: Nurse Practitioner

## 2024-03-16 ENCOUNTER — Ambulatory Visit: Admitting: Nurse Practitioner

## 2024-05-24 DIAGNOSIS — K5900 Constipation, unspecified: Secondary | ICD-10-CM | POA: Diagnosis not present

## 2024-05-24 DIAGNOSIS — N3289 Other specified disorders of bladder: Secondary | ICD-10-CM | POA: Diagnosis not present

## 2024-05-24 DIAGNOSIS — D259 Leiomyoma of uterus, unspecified: Secondary | ICD-10-CM | POA: Diagnosis not present

## 2024-05-24 DIAGNOSIS — R1030 Lower abdominal pain, unspecified: Secondary | ICD-10-CM | POA: Diagnosis not present

## 2024-05-24 DIAGNOSIS — N3000 Acute cystitis without hematuria: Secondary | ICD-10-CM | POA: Diagnosis not present

## 2024-05-24 DIAGNOSIS — R1110 Vomiting, unspecified: Secondary | ICD-10-CM | POA: Diagnosis not present

## 2024-05-24 DIAGNOSIS — R112 Nausea with vomiting, unspecified: Secondary | ICD-10-CM | POA: Diagnosis not present

## 2024-05-31 NOTE — Progress Notes (Signed)
   Established Patient Office Visit  Subjective:     Patient ID: Katherine Wu, female    DOB: [birth year], 38 y.o.   MRN: [id number]  No chief complaint on file.   HPI  Discussed the use of AI scribe software for clinical note transcription with the patient, who gave verbal consent to proceed. ER follow up  History of Present Illness      ROS Per HPI      Objective:    There were no vitals taken for this visit.   Physical Exam Vitals and nursing note reviewed.  Constitutional:      General: She is not in acute distress.    Appearance: Normal appearance. She is normal weight.  HENT:     Head: Normocephalic and atraumatic.     Right Ear: External ear normal.     Left Ear: External ear normal.     Nose: Nose normal.     Mouth/Throat:     Mouth: Mucous membranes are moist.     Pharynx: Oropharynx is clear.  Eyes:     Extraocular Movements: Extraocular movements intact.     Pupils: Pupils are equal, round, and reactive to light.  Cardiovascular:     Rate and Rhythm: Normal rate and regular rhythm.     Pulses: Normal pulses.     Heart sounds: Normal heart sounds.  Pulmonary:     Effort: Pulmonary effort is normal. No respiratory distress.     Breath sounds: Normal breath sounds. No wheezing, rhonchi or rales.  Musculoskeletal:        General: Normal range of motion.     Cervical back: Normal range of motion.     Right lower leg: No edema.     Left lower leg: No edema.  Lymphadenopathy:     Cervical: No cervical adenopathy.  Neurological:     General: No focal deficit present.     Mental Status: She is alert and oriented to person, place, and time.  Psychiatric:        Mood and Affect: Mood normal.        Thought Content: Thought content normal.     No results found for any visits on 06/01/24.  The ASCVD Risk score (Arnett DK, et al., 2019) failed to calculate for the following reasons:   The 2019 ASCVD risk score is only valid for ages 66 to  16  {Vitals History (Optional):23777}  {Show previous labs (optional):23779}     Assessment & Plan:   Assessment and Plan Assessment & Plan      No orders of the defined types were placed in this encounter.    No orders of the defined types were placed in this encounter.   No follow-ups on file.  Corean LITTIE Ku, FNP

## 2024-06-01 ENCOUNTER — Encounter: Payer: Self-pay | Admitting: Family Medicine

## 2024-06-01 ENCOUNTER — Ambulatory Visit: Admitting: Family Medicine

## 2024-06-01 VITALS — BP 160/106 | HR 79 | Temp 97.9°F | Ht 70.75 in | Wt 191.0 lb

## 2024-06-01 DIAGNOSIS — R300 Dysuria: Secondary | ICD-10-CM

## 2024-06-01 DIAGNOSIS — D219 Benign neoplasm of connective and other soft tissue, unspecified: Secondary | ICD-10-CM

## 2024-06-01 DIAGNOSIS — I10 Essential (primary) hypertension: Secondary | ICD-10-CM

## 2024-06-01 DIAGNOSIS — R5383 Other fatigue: Secondary | ICD-10-CM | POA: Diagnosis not present

## 2024-06-01 LAB — POCT URINALYSIS DIP (CLINITEK)
Bilirubin, UA: NEGATIVE
Blood, UA: NEGATIVE
Glucose, UA: NEGATIVE mg/dL
Ketones, POC UA: NEGATIVE mg/dL
Leukocytes, UA: NEGATIVE
Nitrite, UA: NEGATIVE
POC PROTEIN,UA: NEGATIVE
Spec Grav, UA: 1.02 (ref 1.010–1.025)
Urobilinogen, UA: 0.2 U/dL
pH, UA: 6 (ref 5.0–8.0)

## 2024-06-01 MED ORDER — LOSARTAN POTASSIUM 25 MG PO TABS: 25.0000 mg | ORAL_TABLET | Freq: Every day | ORAL | 1 refills | Status: DC

## 2024-06-01 NOTE — Patient Instructions (Signed)
 Check BP at home once daily in the mornings for the next month. Follow up with me in a month to recheck blood pressures.  I have sent in losartan  25 mg one tablet once daily in the morning.

## 2024-06-02 LAB — URINE CULTURE: Result:: NO GROWTH

## 2024-06-04 ENCOUNTER — Ambulatory Visit: Payer: Self-pay | Admitting: Family Medicine

## 2024-06-08 ENCOUNTER — Telehealth: Payer: Self-pay

## 2024-06-08 NOTE — Telephone Encounter (Signed)
 Attempted to call pt twice, pt picked up the phone without answering and hanged up twice.   If pt calls, please make her aware that we can not do that on her behalf, she will have to keep trying to reach them.

## 2024-06-08 NOTE — Telephone Encounter (Signed)
 Copied from CRM [id number]. Topic: General - Other >> Jun 08, 2024  9:07 AM Rosina BIRCH wrote: Reason for CRM: patient called stating she would like the office to reach out to the obgyn office on her behalf to make an appointment for her because they are not answering the phone or responding back to voicemails. Patient stated she was referred there by her provider CB [phone]

## 2024-06-13 ENCOUNTER — Other Ambulatory Visit: Payer: Self-pay | Admitting: Nurse Practitioner

## 2024-06-13 NOTE — Telephone Encounter (Unsigned)
 Copied from CRM #[id number]. Topic: Clinical - Medication Refill >> Jun 13, 2024  9:20 AM Thersia C wrote: Medication: albuterol  (VENTOLIN  HFA) 108 (90 Base) MCG/ACT inhaler   Has the patient contacted their pharmacy? Yes (Agent: If no, request that the patient contact the pharmacy for the refill. If patient does not wish to contact the pharmacy document the reason why and proceed with request.) (Agent: If yes, when and what did the pharmacy advise?)  This is the patient's preferred pharmacy:  [id number] - [address] [place] [address] Phone: [phone] Fax: [phone]   Is this the correct pharmacy for this prescription? Yes If no, delete pharmacy and type the correct one.   Has the prescription been filled recently? No  Is the patient out of the medication? Yes  Has the patient been seen for an appointment in the last year OR does the patient have an upcoming appointment? Yes  Can we respond through MyChart? Yes  Agent: Please be advised that Rx refills may take up to 3 business days. We ask that you follow-up with your pharmacy.

## 2024-06-13 NOTE — Telephone Encounter (Unsigned)
 Copied from CRM [id number]. Topic: Referral - Question >> Jun 13, 2024  9:18 AM Thersia BROCKS wrote: Reason for CRM: Patient called in regarding referral, stated that the place that she was referred to didn't have anything sooner, would like to know if she could get a referral sent to  As emergent   Jon Jonette Rummer, MD [facility] [address], [address] [place], [place] [address] [phone]

## 2024-06-14 MED ORDER — ALBUTEROL SULFATE HFA 108 (90 BASE) MCG/ACT IN AERS: 2.0000 | INHALATION_SPRAY | RESPIRATORY_TRACT | 0 refills | Status: AC | PRN

## 2024-06-27 ENCOUNTER — Telehealth: Payer: Self-pay | Admitting: Radiology

## 2024-06-27 NOTE — Telephone Encounter (Signed)
 Copied from CRM #[id number]. Topic: Referral - Status >> Jun 27, 2024  9:22 AM Laymon HERO wrote: Reason for CRM: patient calling to get an update on referral to Jon Jonette Rummer, MD at [facility]. She has not heard anything and its been over a week.

## 2024-06-29 ENCOUNTER — Ambulatory Visit: Admitting: Nurse Practitioner

## 2024-06-29 VITALS — BP 130/90 | HR 64 | Temp 98.2°F | Ht 70.75 in | Wt 189.0 lb

## 2024-06-29 DIAGNOSIS — R300 Dysuria: Secondary | ICD-10-CM

## 2024-06-29 DIAGNOSIS — D219 Benign neoplasm of connective and other soft tissue, unspecified: Secondary | ICD-10-CM | POA: Diagnosis not present

## 2024-06-29 DIAGNOSIS — I10 Essential (primary) hypertension: Secondary | ICD-10-CM

## 2024-06-29 DIAGNOSIS — Z131 Encounter for screening for diabetes mellitus: Secondary | ICD-10-CM

## 2024-06-29 DIAGNOSIS — Z136 Encounter for screening for cardiovascular disorders: Secondary | ICD-10-CM | POA: Diagnosis not present

## 2024-06-29 DIAGNOSIS — Z13220 Encounter for screening for lipoid disorders: Secondary | ICD-10-CM | POA: Diagnosis not present

## 2024-06-29 LAB — URINALYSIS, ROUTINE W REFLEX MICROSCOPIC
Bilirubin Urine: NEGATIVE
Hgb urine dipstick: NEGATIVE
Ketones, ur: NEGATIVE
Leukocytes,Ua: NEGATIVE
Nitrite: NEGATIVE
Specific Gravity, Urine: 1.01 (ref 1.000–1.030)
Total Protein, Urine: NEGATIVE
Urine Glucose: NEGATIVE
Urobilinogen, UA: 0.2 (ref 0.0–1.0)
pH: 7 (ref 5.0–8.0)

## 2024-06-29 LAB — COMPREHENSIVE METABOLIC PANEL WITH GFR
ALT: 12 U/L (ref 0–35)
AST: 16 U/L (ref 0–37)
Albumin: 4.3 g/dL (ref 3.5–5.2)
Alkaline Phosphatase: 48 U/L (ref 39–117)
BUN: 10 mg/dL (ref 6–23)
CO2: 27 meq/L (ref 19–32)
Calcium: 9 mg/dL (ref 8.4–10.5)
Chloride: 106 meq/L (ref 96–112)
Creatinine, Ser: 0.8 mg/dL (ref 0.40–1.20)
GFR: 93.48 mL/min (ref >60.00)
Glucose, Bld: 88 mg/dL (ref 70–99)
Potassium: 3.9 meq/L (ref 3.5–5.1)
Sodium: 138 meq/L (ref 135–145)
Total Bilirubin: 0.3 mg/dL (ref 0.2–1.2)
Total Protein: 7.1 g/dL (ref 6.0–8.3)

## 2024-06-29 LAB — CBC
HCT: 34.2 % — ABNORMAL LOW (ref 36.0–46.0)
Hemoglobin: 11 g/dL — ABNORMAL LOW (ref 12.0–15.0)
MCHC: 32.1 g/dL (ref 30.0–36.0)
MCV: 86.9 fl (ref 78.0–100.0)
Platelets: 337 K/uL (ref 150.0–400.0)
RBC: 3.94 Mil/uL (ref 3.87–5.11)
RDW: 13.5 % (ref 11.5–15.5)
WBC: 4.4 K/uL (ref 4.0–10.5)

## 2024-06-29 LAB — LIPID PANEL
Cholesterol: 136 mg/dL (ref 0–200)
HDL: 46.2 mg/dL (ref >39.00)
LDL Cholesterol: 73 mg/dL (ref 0–99)
NonHDL: 89.31
Total CHOL/HDL Ratio: 3
Triglycerides: 82 mg/dL (ref 0.0–149.0)
VLDL: 16.4 mg/dL (ref 0.0–40.0)

## 2024-06-29 LAB — POCT URINE PREGNANCY: Preg Test, Ur: NEGATIVE

## 2024-06-29 LAB — TSH: TSH: 0.75 u[IU]/mL (ref 0.35–5.50)

## 2024-06-29 LAB — HEMOGLOBIN A1C: Hgb A1c MFr Bld: 5.5 % (ref 4.6–6.5)

## 2024-06-29 NOTE — Assessment & Plan Note (Signed)
 Labs ordered, further recommendations may be made based upon these results

## 2024-06-29 NOTE — Assessment & Plan Note (Signed)
 Hypertension, borderline control Blood pressure is borderline at 130/90 mmHg, improved from 160/106 mmHg. She is managing blood pressure naturally without medication. Discussed DASH diet, exercise, and risks of uncontrolled hypertension.  - Provide DASH diet handout. - Advise 150 minutes of exercise per week. - Monitor blood pressure at home a few times a week. - Discuss potential need for medication if blood pressure remains elevated.

## 2024-06-29 NOTE — Telephone Encounter (Signed)
 Pt was seen today 06/29/24 for an office visit, issue about referral has been address and new one has been placed

## 2024-06-29 NOTE — Progress Notes (Signed)
 Established Patient Office Visit  Subjective   Patient ID: Katherine Wu, female    DOB: [birth year]  Age: 38 y.o. MRN: [id number]  Chief Complaint  Patient presents with   Abdominal Pain    Discussed the use of AI scribe software for clinical note transcription with the patient, who gave verbal consent to proceed.  History of Present Illness Katherine D Benscoter is a 38 year old female with fibroids who presents for a four-week follow-up   Hypertension - Prescribed losartan  25 mg daily by colleague last month - Patient has not started taking medication, she would prefer not to take medication if possible - Has been focusing on lifestyle modification - Current smoker  Pelvic discomfort and abdominal pressure - Significant lower abdominal discomfort and pressure attributed to uterine fibroids.  Imaging and recent ER visit confirms presence of fibroids - Symptoms worsen when lying down, interfering with sleep - Able to palpate fibroids externally  Urinary symptoms - Urinary frequency, urgency, mild dysuria - Would like to rule out UTI       ROS: see HPI    Objective:     BP (!) 130/90   Pulse 64   Temp 98.2 F (36.8 C) (Temporal)   Ht 5' 10.75 (1.797 m)   Wt 189 lb (85.7 kg)   SpO2 97%   BMI 26.55 kg/m  BP Readings from Last 3 Encounters:  06/29/24 (!) 130/90  06/01/24 (!) 160/106  12/03/22 116/80   Wt Readings from Last 3 Encounters:  06/29/24 189 lb (85.7 kg)  06/01/24 191 lb (86.6 kg)  12/03/22 213 lb 4 oz (96.7 kg)      Physical Exam Vitals reviewed.  Constitutional:      General: She is not in acute distress.    Appearance: Normal appearance.  HENT:     Head: Normocephalic and atraumatic.  Neck:     Vascular: No carotid bruit.  Cardiovascular:     Rate and Rhythm: Normal rate and regular rhythm.     Pulses: Normal pulses.     Heart sounds: Normal heart sounds.  Pulmonary:     Effort: Pulmonary effort is normal.     Breath sounds: Normal  breath sounds.  Abdominal:     General: There is distension.     Palpations: There is mass.  Skin:    General: Skin is warm and dry.  Neurological:     General: No focal deficit present.     Mental Status: She is alert and oriented to person, place, and time.  Psychiatric:        Mood and Affect: Mood normal.        Behavior: Behavior normal.        Judgment: Judgment normal.      Results for orders placed or performed in visit on 06/29/24  TSH  Result Value Ref Range   TSH 0.75 0.35 - 5.50 uIU/mL  Lipid panel  Result Value Ref Range   Cholesterol 136 0 - 200 mg/dL   Triglycerides 17.9 0.0 - 149.0 mg/dL   HDL 53.79 >60.99 mg/dL   VLDL 83.5 0.0 - 59.9 mg/dL   LDL Cholesterol 73 0 - 99 mg/dL   Total CHOL/HDL Ratio 3    NonHDL 89.31   Hemoglobin A1c  Result Value Ref Range   Hgb A1c MFr Bld 5.5 4.6 - 6.5 %  CBC  Result Value Ref Range   WBC 4.4 4.0 - 10.5 K/uL   RBC 3.94 3.87 - 5.11  Mil/uL   Platelets 337.0 150.0 - 400.0 K/uL   Hemoglobin 11.0 (L) 12.0 - 15.0 g/dL   HCT 65.7 (L) 63.9 - 53.9 %   MCV 86.9 78.0 - 100.0 fl   MCHC 32.1 30.0 - 36.0 g/dL   RDW 86.4 88.4 - 84.4 %  Comprehensive metabolic panel with GFR  Result Value Ref Range   Sodium 138 135 - 145 mEq/L   Potassium 3.9 3.5 - 5.1 mEq/L   Chloride 106 96 - 112 mEq/L   CO2 27 19 - 32 mEq/L   Glucose, Bld 88 70 - 99 mg/dL   BUN 10 6 - 23 mg/dL   Creatinine, Ser 9.19 0.40 - 1.20 mg/dL   Total Bilirubin 0.3 0.2 - 1.2 mg/dL   Alkaline Phosphatase 48 39 - 117 U/L   AST 16 0 - 37 U/L   ALT 12 0 - 35 U/L   Total Protein 7.1 6.0 - 8.3 g/dL   Albumin 4.3 3.5 - 5.2 g/dL   GFR 06.51 >39.99 mL/min   Calcium 9.0 8.4 - 10.5 mg/dL  POCT urine pregnancy  Result Value Ref Range   Preg Test, Ur Negative Negative      The ASCVD Risk score (Arnett DK, et al., 2019) failed to calculate for the following reasons:   The 2019 ASCVD risk score is only valid for ages 23 to 70    Assessment & Plan:   Problem List  Items Addressed This Visit       Cardiovascular and Mediastinum   Essential hypertension   Hypertension, borderline control Blood pressure is borderline at 130/90 mmHg, improved from 160/106 mmHg. She is managing blood pressure naturally without medication. Discussed DASH diet, exercise, and risks of uncontrolled hypertension.  - Provide DASH diet handout. - Advise 150 minutes of exercise per week. - Monitor blood pressure at home a few times a week. - Discuss potential need for medication if blood pressure remains elevated.      Relevant Orders   Ambulatory referral to Obstetrics / Gynecology   Urinalysis, Routine w reflex microscopic   Urine Culture   Comprehensive metabolic panel with GFR (Completed)   CBC (Completed)   Hemoglobin A1c (Completed)   Lipid panel (Completed)   TSH (Completed)   hCG, serum, qualitative     Other   Encounter for lipid screening for cardiovascular disease   Labs ordered, further recommendations may be made based upon these results       Relevant Orders   Ambulatory referral to Obstetrics / Gynecology   Urinalysis, Routine w reflex microscopic   Urine Culture   Comprehensive metabolic panel with GFR (Completed)   CBC (Completed)   Hemoglobin A1c (Completed)   Lipid panel (Completed)   TSH (Completed)   hCG, serum, qualitative   Diabetes mellitus screening   Labs ordered, further recommendations may be made based upon these results       Relevant Orders   Ambulatory referral to Obstetrics / Gynecology   Urinalysis, Routine w reflex microscopic   Urine Culture   Comprehensive metabolic panel with GFR (Completed)   CBC (Completed)   Hemoglobin A1c (Completed)   Lipid panel (Completed)   TSH (Completed)   hCG, serum, qualitative   Fibroids - Primary   Uterine fibroids Fibroids have increased in size, causing bladder compression symptoms. Previous ER visit suggested bladder infection, but symptoms persist. No masses on imaging, but  fibroids palpable externally. - Refer to [facility] for further evaluation and management, potentially including  surgical intervention. - Order urinalysis to check for urinary tract infection. - Order urine culture if urinalysis suggests infection. - Order updated kidney function tests.      Relevant Orders   Ambulatory referral to Obstetrics / Gynecology   Dysuria   Dysuria Reports dysuria and difficulty urinating, likely related to fibroid compression.  - Order urinalysis to check for urinary tract infection. - Order urine culture if urinalysis suggests infection.      Relevant Orders   Ambulatory referral to Obstetrics / Gynecology   Urinalysis, Routine w reflex microscopic   Urine Culture   Comprehensive metabolic panel with GFR (Completed)   CBC (Completed)   Hemoglobin A1c (Completed)   Lipid panel (Completed)   TSH (Completed)   hCG, serum, qualitative   POCT urine pregnancy (Completed)   Assessment and Plan Assessment & Plan Uterine fibroids Fibroids have increased in size, causing bladder compression symptoms. Previous ER visit suggested bladder infection, but symptoms persist. No masses on imaging, but fibroids palpable externally. - Refer to OB GYN for further evaluation and management, potentially including surgical intervention. - Order urinalysis to check for urinary tract infection. - Order urine culture if urinalysis suggests infection. - Order updated kidney function tests.   Hypertension, borderline control Blood pressure is borderline at 130/90 mmHg, improved from 160/106 mmHg. She is managing blood pressure naturally without medication. Discussed DASH diet, exercise, and risks of uncontrolled hypertension.  - Provide DASH diet handout. - Advise 150 minutes of exercise per week. - Monitor blood pressure at home a few times a week. - Discuss potential need for medication if blood pressure remains elevated.   Vaccination She declined the flu shot offered  during the visit.   Dysuria Reports dysuria and difficulty urinating, likely related to fibroid compression.  - Order urinalysis to check for urinary tract infection. - Order urine culture if urinalysis suggests infection.   Return in about 6 weeks (around 08/10/2024).    Lauraine FORBES Pereyra, NP

## 2024-06-29 NOTE — Assessment & Plan Note (Signed)
 Uterine fibroids Fibroids have increased in size, causing bladder compression symptoms. Previous ER visit suggested bladder infection, but symptoms persist. No masses on imaging, but fibroids palpable externally. - Refer to OB GYN for further evaluation and management, potentially including surgical intervention. - Order urinalysis to check for urinary tract infection. - Order urine culture if urinalysis suggests infection. - Order updated kidney function tests.

## 2024-06-29 NOTE — Assessment & Plan Note (Signed)
 Dysuria Reports dysuria and difficulty urinating, likely related to fibroid compression.  - Order urinalysis to check for urinary tract infection. - Order urine culture if urinalysis suggests infection.

## 2024-06-30 LAB — URINE CULTURE: Result:: NO GROWTH

## 2024-06-30 LAB — HCG, SERUM, QUALITATIVE: Preg, Serum: NEGATIVE

## 2024-07-02 ENCOUNTER — Ambulatory Visit: Payer: Self-pay | Admitting: Nurse Practitioner

## 2024-08-24 DIAGNOSIS — Z130 Encounter for screening for diseases of the blood and blood-forming organs and certain disorders involving the immune mechanism: Secondary | ICD-10-CM | POA: Diagnosis not present

## 2024-08-24 DIAGNOSIS — D259 Leiomyoma of uterus, unspecified: Secondary | ICD-10-CM | POA: Diagnosis not present

## 2024-09-28 DIAGNOSIS — D259 Leiomyoma of uterus, unspecified: Secondary | ICD-10-CM | POA: Diagnosis not present
